# Patient Record
Sex: Male | Born: 1965 | Race: White | Hispanic: No | Smoking: Current every day smoker
Health system: Southern US, Community
[De-identification: ages and names within clinical notes are randomized; demographics above are authoritative.]

## PROBLEM LIST (undated history)

## (undated) DIAGNOSIS — B019 Varicella without complication: Secondary | ICD-10-CM

## (undated) DIAGNOSIS — G43909 Migraine, unspecified, not intractable, without status migrainosus: Secondary | ICD-10-CM

## (undated) DIAGNOSIS — I00 Rheumatic fever without heart involvement: Secondary | ICD-10-CM

## (undated) HISTORY — PX: TOOTH EXTRACTION: SUR596

## (undated) HISTORY — DX: Varicella without complication: B01.9

## (undated) HISTORY — DX: Rheumatic fever without heart involvement: I00

## (undated) HISTORY — PX: MEDIAL PARTIAL KNEE REPLACEMENT: SHX5965

---

## 1999-10-31 ENCOUNTER — Emergency Department (HOSPITAL_COMMUNITY): Admission: EM | Admit: 1999-10-31 | Discharge: 1999-10-31 | Payer: Self-pay | Admitting: Emergency Medicine

## 1999-10-31 ENCOUNTER — Encounter: Payer: Self-pay | Admitting: *Deleted

## 2010-05-03 ENCOUNTER — Ambulatory Visit: Payer: Self-pay | Admitting: Infectious Diseases

## 2010-05-03 ENCOUNTER — Inpatient Hospital Stay (HOSPITAL_COMMUNITY): Admission: EM | Admit: 2010-05-03 | Discharge: 2010-05-04 | Payer: Self-pay | Admitting: Emergency Medicine

## 2010-10-11 ENCOUNTER — Emergency Department (HOSPITAL_COMMUNITY): Admission: EM | Admit: 2010-10-11 | Discharge: 2010-10-12 | Payer: Self-pay | Admitting: Emergency Medicine

## 2010-10-13 ENCOUNTER — Emergency Department (HOSPITAL_COMMUNITY): Admission: EM | Admit: 2010-10-13 | Discharge: 2010-10-13 | Payer: Self-pay | Admitting: Emergency Medicine

## 2011-02-09 LAB — CBC
HCT: 43.4 % (ref 39.0–52.0)
Hemoglobin: 14.9 g/dL (ref 13.0–17.0)
MCH: 31.8 pg (ref 26.0–34.0)
MCHC: 34.3 g/dL (ref 30.0–36.0)
MCV: 92.5 fL (ref 78.0–100.0)
Platelets: 212 10*3/uL (ref 150–400)
RBC: 4.69 MIL/uL (ref 4.22–5.81)
RDW: 12.4 % (ref 11.5–15.5)
WBC: 11.8 10*3/uL — ABNORMAL HIGH (ref 4.0–10.5)

## 2011-02-09 LAB — POCT I-STAT, CHEM 8
BUN: 11 mg/dL (ref 6–23)
Calcium, Ion: 1.18 mmol/L (ref 1.12–1.32)
Chloride: 102 mEq/L (ref 96–112)
Glucose, Bld: 121 mg/dL — ABNORMAL HIGH (ref 70–99)
TCO2: 28 mmol/L (ref 0–100)

## 2011-02-09 LAB — DIFFERENTIAL
Basophils Relative: 0 % (ref 0–1)
Eosinophils Relative: 1 % (ref 0–5)
Monocytes Absolute: 0.9 10*3/uL (ref 0.1–1.0)
Monocytes Relative: 7 % (ref 3–12)
Neutro Abs: 8.2 10*3/uL — ABNORMAL HIGH (ref 1.7–7.7)

## 2011-02-15 LAB — GLUCOSE, CAPILLARY: Glucose-Capillary: 127 mg/dL — ABNORMAL HIGH (ref 70–99)

## 2011-02-15 LAB — MAGNESIUM: Magnesium: 1.8 mg/dL (ref 1.5–2.5)

## 2011-02-15 LAB — HIV ANTIBODY (ROUTINE TESTING W REFLEX): HIV: NONREACTIVE

## 2011-02-15 LAB — T4, FREE: Free T4: 0.79 ng/dL — ABNORMAL LOW (ref 0.80–1.80)

## 2011-02-15 LAB — CARDIAC PANEL(CRET KIN+CKTOT+MB+TROPI)
Relative Index: 0.7 (ref 0.0–2.5)
Relative Index: 0.8 (ref 0.0–2.5)
Total CK: 107 U/L (ref 7–232)
Total CK: 112 U/L (ref 7–232)
Troponin I: 0.01 ng/mL (ref 0.00–0.06)
Troponin I: 0.01 ng/mL (ref 0.00–0.06)

## 2011-02-15 LAB — CBC
HCT: 37.8 % — ABNORMAL LOW (ref 39.0–52.0)
Hemoglobin: 15.2 g/dL (ref 13.0–17.0)
MCHC: 35.2 g/dL (ref 30.0–36.0)
MCHC: 35.4 g/dL (ref 30.0–36.0)
MCHC: 35.6 g/dL (ref 30.0–36.0)
MCV: 95.1 fL (ref 78.0–100.0)
MCV: 95.5 fL (ref 78.0–100.0)
Platelets: 136 10*3/uL — ABNORMAL LOW (ref 150–400)
Platelets: 148 10*3/uL — ABNORMAL LOW (ref 150–400)
RBC: 4.58 MIL/uL (ref 4.22–5.81)
WBC: 5.1 10*3/uL (ref 4.0–10.5)

## 2011-02-15 LAB — LIPID PANEL
Cholesterol: 129 mg/dL (ref 0–200)
HDL: 47 mg/dL (ref >39)
LDL Cholesterol: 70 mg/dL (ref 0–99)
Total CHOL/HDL Ratio: 2.7 RATIO
Triglycerides: 60 mg/dL (ref <150)
VLDL: 12 mg/dL (ref 0–40)

## 2011-02-15 LAB — LEGIONELLA ANTIGEN, URINE: Legionella Antigen, Urine: NEGATIVE

## 2011-02-15 LAB — COMPREHENSIVE METABOLIC PANEL
ALT: 16 U/L (ref 0–53)
Alkaline Phosphatase: 85 U/L (ref 39–117)
CO2: 24 mEq/L (ref 19–32)
Calcium: 9.1 mg/dL (ref 8.4–10.5)
GFR calc non Af Amer: >60 mL/min (ref >60)
Glucose, Bld: 110 mg/dL — ABNORMAL HIGH (ref 70–99)
Sodium: 133 mEq/L — ABNORMAL LOW (ref 135–145)
Total Bilirubin: 0.7 mg/dL (ref 0.3–1.2)

## 2011-02-15 LAB — BASIC METABOLIC PANEL
BUN: 7 mg/dL (ref 6–23)
BUN: 9 mg/dL (ref 6–23)
CO2: 27 mEq/L (ref 19–32)
CO2: 27 mEq/L (ref 19–32)
Chloride: 105 mEq/L (ref 96–112)
Chloride: 107 mEq/L (ref 96–112)
Creatinine, Ser: 1.01 mg/dL (ref 0.4–1.5)
Potassium: 3.3 mEq/L — ABNORMAL LOW (ref 3.5–5.1)

## 2011-02-15 LAB — URINALYSIS, ROUTINE W REFLEX MICROSCOPIC
Bilirubin Urine: NEGATIVE
Glucose, UA: NEGATIVE mg/dL
Ketones, ur: NEGATIVE mg/dL
Protein, ur: NEGATIVE mg/dL

## 2011-02-15 LAB — DIFFERENTIAL
Basophils Absolute: 0 10*3/uL (ref 0.0–0.1)
Basophils Relative: 0 % (ref 0–1)
Eosinophils Absolute: 0 10*3/uL (ref 0.0–0.7)
Lymphs Abs: 0.7 10*3/uL (ref 0.7–4.0)
Neutrophils Relative %: 83 % — ABNORMAL HIGH (ref 43–77)

## 2011-02-15 LAB — CK TOTAL AND CKMB (NOT AT ARMC)
CK, MB: 0.8 ng/mL (ref 0.3–4.0)
Relative Index: 0.7 (ref 0.0–2.5)
Total CK: 114 U/L (ref 7–232)

## 2011-02-15 LAB — RAPID URINE DRUG SCREEN, HOSP PERFORMED
Amphetamines: NOT DETECTED
Barbiturates: NOT DETECTED
Benzodiazepines: NOT DETECTED
Cocaine: NOT DETECTED
Opiates: NOT DETECTED

## 2011-02-15 LAB — POCT I-STAT 3, ART BLOOD GAS (G3+)
O2 Saturation: 98 %
Patient temperature: 98
TCO2: 25 mmol/L (ref 0–100)

## 2011-02-15 LAB — CULTURE, BLOOD (ROUTINE X 2)

## 2011-02-15 LAB — URINE CULTURE

## 2011-02-15 LAB — ROCKY MTN SPOTTED FVR AB, IGM-BLOOD: RMSF IgM: 0.27 IV (ref 0.00–0.89)

## 2011-02-15 LAB — ACETAMINOPHEN LEVEL: Acetaminophen (Tylenol), Serum: <10 ug/mL — ABNORMAL LOW (ref 10–30)

## 2011-02-15 LAB — PROCALCITONIN: Procalcitonin: <0.5 ng/mL

## 2011-02-15 LAB — LIPASE, BLOOD: Lipase: 22 U/L (ref 11–59)

## 2013-04-06 ENCOUNTER — Emergency Department: Payer: Self-pay | Admitting: Unknown Physician Specialty

## 2015-09-01 ENCOUNTER — Encounter: Payer: Self-pay | Admitting: Emergency Medicine

## 2015-09-01 ENCOUNTER — Emergency Department
Admission: EM | Admit: 2015-09-01 | Discharge: 2015-09-01 | Disposition: A | Discharge status: Home / Self Care | Payer: Worker's Compensation | Attending: Emergency Medicine | Admitting: Emergency Medicine

## 2015-09-01 ENCOUNTER — Emergency Department: Payer: Worker's Compensation

## 2015-09-01 DIAGNOSIS — Y9389 Activity, other specified: Secondary | ICD-10-CM | POA: Insufficient documentation

## 2015-09-01 DIAGNOSIS — S51032A Puncture wound without foreign body of left elbow, initial encounter: Secondary | ICD-10-CM | POA: Diagnosis present

## 2015-09-01 DIAGNOSIS — W228XXA Striking against or struck by other objects, initial encounter: Secondary | ICD-10-CM | POA: Insufficient documentation

## 2015-09-01 DIAGNOSIS — Z23 Encounter for immunization: Secondary | ICD-10-CM | POA: Diagnosis not present

## 2015-09-01 DIAGNOSIS — Y990 Civilian activity done for income or pay: Secondary | ICD-10-CM | POA: Diagnosis not present

## 2015-09-01 DIAGNOSIS — Z720 Tobacco use: Secondary | ICD-10-CM | POA: Diagnosis not present

## 2015-09-01 DIAGNOSIS — Z880 Allergy status to penicillin: Secondary | ICD-10-CM | POA: Diagnosis not present

## 2015-09-01 DIAGNOSIS — Y9289 Other specified places as the place of occurrence of the external cause: Secondary | ICD-10-CM | POA: Insufficient documentation

## 2015-09-01 MED ORDER — CEPHALEXIN 500 MG PO CAPS: 500.0000 mg | ORAL_CAPSULE | Freq: Four times a day (QID) | ORAL | Status: AC

## 2015-09-01 MED ORDER — TETANUS-DIPHTH-ACELL PERTUSSIS 5-2.5-18.5 LF-MCG/0.5 IM SUSP
0.5000 mL | Freq: Once | INTRAMUSCULAR | Status: AC
  Administered 2015-09-01: 0.5 mL via INTRAMUSCULAR
  Filled 2015-09-01: qty 0.5

## 2015-09-01 NOTE — Discharge Instructions (Signed)
Puncture Wound °A puncture wound is an injury that extends through all layers of the skin and into the tissue beneath the skin (subcutaneous tissue). Puncture wounds become infected easily because germs often enter the body and go beneath the skin during the injury. Having a deep wound with a small entrance point makes it difficult for your caregiver to adequately clean the wound. This is especially true if you have stepped on a nail and it has passed through a dirty shoe or other situations where the wound is obviously contaminated. °CAUSES  °Many puncture wounds involve glass, nails, splinters, fish hooks, or other objects that enter the skin (foreign bodies). A puncture wound may also be caused by a human bite or animal bite. °DIAGNOSIS  °A puncture wound is usually diagnosed by your history and a physical exam. You may need to have an X-ray or an ultrasound to check for any foreign bodies still in the wound. °TREATMENT  °· Your caregiver will clean the wound as thoroughly as possible. Depending on the location of the wound, a bandage (dressing) may be applied. °· Your caregiver might prescribe antibiotic medicines. °· You may need a follow-up visit to check on your wound. Follow all instructions as directed by your caregiver. °HOME CARE INSTRUCTIONS  °· Change your dressing once per day, or as directed by your caregiver. If the dressing sticks, it may be removed by soaking the area in water. °· If your caregiver has given you follow-up instructions, it is very important that you return for a follow-up appointment. Not following up as directed could result in a chronic or permanent injury, pain, and disability. °· Only take over-the-counter or prescription medicines for pain, discomfort, or fever as directed by your caregiver. °· If you are given antibiotics, take them as directed. Finish them even if you start to feel better. °You may need a tetanus shot if: °· You cannot remember when you had your last tetanus  shot. °· You have never had a tetanus shot. °If you got a tetanus shot, your arm may swell, get red, and feel warm to the touch. This is common and not a problem. If you need a tetanus shot and you choose not to have one, there is a rare chance of getting tetanus. Sickness from tetanus can be serious. °You may need a rabies shot if an animal bite caused your puncture wound. °SEEK MEDICAL CARE IF:  °· You have redness, swelling, or increasing pain in the wound. °· You have red streaks going away from the wound. °· You notice a bad smell coming from the wound or dressing. °· You have yellowish-white fluid (pus) coming from the wound. °· You are treated with an antibiotic for infection, but the infection is not getting better. °· You notice something in the wound, such as rubber from your shoe, cloth, or another object. °· You have a fever. °· You have severe pain. °· You have difficulty breathing. °· You feel dizzy or faint. °· You cannot stop vomiting. °· You lose feeling, develop numbness, or cannot move a limb below the wound. °· Your symptoms worsen. °MAKE SURE YOU: °· Understand these instructions. °· Will watch your condition. °· Will get help right away if you are not doing well or get worse. °Document Released: 08/25/2005 Document Revised: 02/07/2012 Document Reviewed: 05/04/2011 °ExitCare® Patient Information ©2015 [employer]. This information is not intended to replace advice given to you by your health care provider. Make sure you discuss any questions you   have with your health care provider.  Please return immediately if condition worsens. Please contact her primary physician or the physician you were given for referral. If you have any specialist physicians involved in her treatment and plan please also contact them. Thank you for using  [facility].

## 2015-09-01 NOTE — ED Notes (Signed)
Pt's employer, [employer] recycling, not on file. Marolyn Haller, Scientist, water quality, at bedside and reports he needs a UDS through [facility]. Denies any need to breathalyze patient.

## 2015-09-01 NOTE — ED Notes (Signed)
Pt reports he was working on a piece of machinery at work, a piece of wire stuck him in the left [facility]. Pt reports blood was squirting out of arm, bandage applied, pt pale and diaphoretic.

## 2015-09-02 NOTE — ED Provider Notes (Signed)
Time Seen: Approximately *1700 I have reviewed the triage notes  Chief Complaint: Puncture Wound   History of Present Illness: Randy Roman is a 49 y.o. 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 The patient states that there was "" a lot of blood in it seemed to these "" squirting out of his arm "". Doreatha Martin was applied to seen the patient was described as being pale and diaphoretic. Patient states he feels improved at this point. He denies any other significant injury. Patient tetanus status is unknown  History reviewed. No pertinent past medical history.  There are no active problems to display for this patient.   History reviewed. No pertinent past surgical history.  History reviewed. No pertinent past surgical history.  Current Outpatient Rx  Name  Route  Sig  Dispense  Refill  . cephALEXin (KEFLEX) 500 MG capsule   Oral   Take 1 capsule (500 mg total) by mouth 4 (four) times daily.   28 capsule   0     Allergies:  Penicillins  Family History: No family history on file.  Social History: Social History  Substance Use Topics  . Smoking status: Current Every Day Smoker  . Smokeless tobacco: None  . Alcohol Use: Yes     Review of Systems:   Shows no other significant injury. He is currently not on any anticoagulation therapy He states he is otherwise in good health  Physical Exam:  ED Triage Vitals  Enc Vitals Group     BP 09/01/15 1613 164/98 mmHg     Pulse Rate 09/01/15 1613 119     Resp 09/01/15 1613 20     Temp 09/01/15 1613 97.8 F (36.6 C)     Temp Source 09/01/15 1613 Oral     SpO2 09/01/15 1613 95 %     Weight 09/01/15 1613 195 lb (88.451 kg)     Height 09/01/15 1613  (1.702 m)     Head Cir --      Peak Flow --      Pain Score 09/01/15 1614 5     Pain Loc --      Pain Edu? --      Excl. in GC? --     General: Awake , Alert ,  and Oriented times 3; GCS 15 Head: Normal cephalic , atraumatic Neck: Supple, Full range of motion,  Lungs: Clear to ascultation without wheezes , rhonchi, or rales Heart: Regular rate, regular rhythm without murmurs , gallops , or rubs  Extremities: Close examination of the left elbow shows a punctate lesion anterior remote from the joint. The extremity is neurovascularly intact. There is no active bleeding at this time. 2 plus symmetric pulses. No edema, clubbing or cyanosis Neurologic: normal ambulation, Motor symmetric without deficits, sensory intact Skin: warm, dry, no rashes    Radiology:  X-ray showed no foreign body present I personally reviewed the radiologic studies   Procedures: Patient had a left elbow pressure dressing applied.  ED Course: Patient's stay here was uneventful he had no other further bleeding. I advised him to leave the current dressing in place for 24 hours and then change it. He was prescribed Keflex on an outpatient basis. He was advised to return here if he has any other new concerns. The injury did not appear to affect any deep vascular structure and most  likely was a skin than usual or arterial. The wound is punctate and did not appear to have any significant depth that led and fall of the left elbow joint    Assessment:  Puncture wound left elbow   Final Clinical Impression:   Final diagnoses:  Puncture wound of elbow, left, initial encounter     Plan:  Outpatient management           Jennye Moccasin, MD 09/02/15 [phone]

## 2016-02-08 ENCOUNTER — Encounter (HOSPITAL_COMMUNITY): Payer: Self-pay | Admitting: Emergency Medicine

## 2016-02-08 ENCOUNTER — Emergency Department (HOSPITAL_COMMUNITY)
Admission: EM | Admit: 2016-02-08 | Discharge: 2016-02-08 | Disposition: A | Discharge status: Home / Self Care | Payer: Self-pay | Attending: Emergency Medicine | Admitting: Emergency Medicine

## 2016-02-08 ENCOUNTER — Emergency Department (HOSPITAL_COMMUNITY): Payer: Self-pay

## 2016-02-08 ENCOUNTER — Other Ambulatory Visit: Payer: Self-pay

## 2016-02-08 DIAGNOSIS — Z8679 Personal history of other diseases of the circulatory system: Secondary | ICD-10-CM | POA: Insufficient documentation

## 2016-02-08 DIAGNOSIS — Z880 Allergy status to penicillin: Secondary | ICD-10-CM | POA: Insufficient documentation

## 2016-02-08 DIAGNOSIS — F17200 Nicotine dependence, unspecified, uncomplicated: Secondary | ICD-10-CM | POA: Insufficient documentation

## 2016-02-08 DIAGNOSIS — H5710 Ocular pain, unspecified eye: Secondary | ICD-10-CM | POA: Insufficient documentation

## 2016-02-08 DIAGNOSIS — J0100 Acute maxillary sinusitis, unspecified: Secondary | ICD-10-CM | POA: Insufficient documentation

## 2016-02-08 HISTORY — DX: Migraine, unspecified, not intractable, without status migrainosus: G43.909

## 2016-02-08 LAB — DIFFERENTIAL
BASOS ABS: 0 10*3/uL (ref 0.0–0.1)
BASOS PCT: 0 %
EOS ABS: 0.1 10*3/uL (ref 0.0–0.7)
EOS PCT: 1 %
Lymphocytes Relative: 22 %
Lymphs Abs: 2 10*3/uL (ref 0.7–4.0)
MONO ABS: 0.6 10*3/uL (ref 0.1–1.0)
MONOS PCT: 6 %
Neutro Abs: 6.7 10*3/uL (ref 1.7–7.7)
Neutrophils Relative %: 71 %

## 2016-02-08 LAB — COMPREHENSIVE METABOLIC PANEL
ALT: 19 U/L (ref 17–63)
ANION GAP: 12 (ref 5–15)
AST: 19 U/L (ref 15–41)
Albumin: 4 g/dL (ref 3.5–5.0)
Alkaline Phosphatase: 105 U/L (ref 38–126)
BUN: 9 mg/dL (ref 6–20)
CALCIUM: 9.5 mg/dL (ref 8.9–10.3)
CO2: 25 mmol/L (ref 22–32)
CREATININE: 0.97 mg/dL (ref 0.61–1.24)
Chloride: 100 mmol/L — ABNORMAL LOW (ref 101–111)
GLUCOSE: 100 mg/dL — AB (ref 65–99)
POTASSIUM: 4.5 mmol/L (ref 3.5–5.1)
Sodium: 137 mmol/L (ref 135–145)
TOTAL PROTEIN: 7.5 g/dL (ref 6.5–8.1)
Total Bilirubin: 0.8 mg/dL (ref 0.3–1.2)

## 2016-02-08 LAB — I-STAT TROPONIN, ED: TROPONIN I, POC: 0.01 ng/mL (ref 0.00–0.08)

## 2016-02-08 LAB — CBC
HEMATOCRIT: 49.3 % (ref 39.0–52.0)
Hemoglobin: 16.3 g/dL (ref 13.0–17.0)
MCH: 32 pg (ref 26.0–34.0)
MCHC: 33.1 g/dL (ref 30.0–36.0)
MCV: 96.7 fL (ref 78.0–100.0)
Platelets: 268 10*3/uL (ref 150–400)
RBC: 5.1 MIL/uL (ref 4.22–5.81)
RDW: 13.1 % (ref 11.5–15.5)
WBC: 9.3 10*3/uL (ref 4.0–10.5)

## 2016-02-08 MED ORDER — PREDNISONE 50 MG PO TABS: ORAL_TABLET | ORAL | Status: DC

## 2016-02-08 MED ORDER — SODIUM CHLORIDE 0.9 % IV BOLUS (SEPSIS)
1000.0000 mL | Freq: Once | INTRAVENOUS | Status: AC
Start: 2016-02-08 — End: 2016-02-08
  Administered 2016-02-08: 1000 mL via INTRAVENOUS

## 2016-02-08 MED ORDER — DOXYCYCLINE HYCLATE 100 MG PO CAPS: 100.0000 mg | ORAL_CAPSULE | Freq: Two times a day (BID) | ORAL | Status: DC

## 2016-02-08 MED ORDER — OXYCODONE-ACETAMINOPHEN 5-325 MG PO TABS: ORAL_TABLET | ORAL | Status: DC

## 2016-02-08 MED ORDER — ONDANSETRON HCL 4 MG/2ML IJ SOLN
4.0000 mg | Freq: Once | INTRAMUSCULAR | Status: AC
  Administered 2016-02-08: 4 mg via INTRAVENOUS
  Filled 2016-02-08: qty 2

## 2016-02-08 MED ORDER — DOXYCYCLINE HYCLATE 100 MG PO TABS
100.0000 mg | ORAL_TABLET | Freq: Once | ORAL | Status: AC
  Administered 2016-02-08: 100 mg via ORAL
  Filled 2016-02-08: qty 1

## 2016-02-08 MED ORDER — MORPHINE SULFATE (PF) 4 MG/ML IV SOLN
4.0000 mg | Freq: Once | INTRAVENOUS | Status: AC
  Administered 2016-02-08: 4 mg via INTRAVENOUS
  Filled 2016-02-08: qty 1

## 2016-02-08 MED ORDER — OXYCODONE-ACETAMINOPHEN 5-325 MG PO TABS
1.0000 | ORAL_TABLET | Freq: Once | ORAL | Status: AC
  Administered 2016-02-08: 1 via ORAL
  Filled 2016-02-08: qty 1

## 2016-02-08 MED ORDER — PREDNISONE 20 MG PO TABS
60.0000 mg | ORAL_TABLET | Freq: Once | ORAL | Status: AC
  Administered 2016-02-08: 60 mg via ORAL
  Filled 2016-02-08: qty 3

## 2016-02-08 NOTE — ED Notes (Signed)
Pt verbalized understanding of d/c instructions and has no further questions. Pt stable and nad. Pt d/c home with wife driving.

## 2016-02-08 NOTE — ED Provider Notes (Signed)
CSN: [id number]     Arrival date & time 02/08/16  1424 History   First MD Initiated Contact with Patient 02/08/16 1624     Chief Complaint  Patient presents with  . Headache  . Eye Pain     (Consider location/radiation/quality/duration/timing/severity/associated sxs/prior Treatment) HPI  Blood pressure 168/100, pulse 91, temperature 97.5 F (36.4 C), temperature source Oral, resp. rate 22, weight 94.15 kg, SpO2 95 %.  Randy Roman is a 50 y.o. male complaining of severe right-sided periorbital headache onset late Friday night Saturday morning, woke him from sleep. Patient typically does not have headaches. He states that the pain comes in waves. States it's 5 out of 10 right now, 10 out of 10 at worst. Cannot quantify how long it took from the onset of the headache to reach maximal intensity. Patient states that the pain is exacerbated by leaning forward, Valsalva or cough. He denies fever, chills, change in vision, dysarthria, cervicalgia, syncope, numbness, weakness, chest pain, abdominal pain, nausea, vomiting. On review of systems patient notes a ataxia with no falling. Note that triage note states that patient is unable to keep his eyes open however patient denies this to me. States that when the pain is severe he closes his eyes.   Past Medical History  Diagnosis Date  . Migraine    History reviewed. No pertinent past surgical history. No family history on file. Social History  Substance Use Topics  . Smoking status: Current Every Day Smoker  . Smokeless tobacco: None  . Alcohol Use: Yes    Review of Systems  10 systems reviewed and found to be negative, except as noted in the HPI.   Allergies  Penicillins and Asa  Home Medications   Prior to Admission medications   Medication Sig Start Date End Date Taking? Authorizing Provider  ibuprofen (ADVIL,MOTRIN) 200 MG tablet Take 800 mg by mouth every 6 (six) hours as needed for headache.   Yes Historical Provider, MD   Protein POWD Take 1 scoop by mouth daily as needed (FOR WORKING OUT).   Yes Historical Provider, MD  doxycycline (VIBRAMYCIN) 100 MG capsule Take 1 capsule (100 mg total) by mouth 2 (two) times daily. 02/08/16   Laiza Veenstra, PA-C  oxyCODONE-acetaminophen (PERCOCET/ROXICET) 5-325 MG tablet 1 to 2 tabs PO q6hrs  PRN for pain 02/08/16   Joni Reining Codi Kertz, PA-C  predniSONE (DELTASONE) 50 MG tablet Take 1 tablet daily with breakfast 02/08/16   Demorio Seeley, PA-C   BP 129/92 mmHg  Pulse 80  Temp(Src) 97.5 F (36.4 C) (Oral)  Resp 18  Wt 94.15 kg  SpO2 95% Physical Exam  Constitutional: He is oriented to person, place, and time. He appears well-developed and well-nourished. No distress.  HENT:  Head: Normocephalic and atraumatic.  Mouth/Throat: Oropharynx is clear and moist.  Eyes: Conjunctivae and EOM are normal. Pupils are equal, round, and reactive to light.  Cardiovascular: Normal rate, regular rhythm and intact distal pulses.   Pulmonary/Chest: Effort normal and breath sounds normal. No stridor. No respiratory distress. He has no wheezes. He has no rales. He exhibits no tenderness.  Abdominal: Soft. Bowel sounds are normal.  Musculoskeletal: Normal range of motion.  Neurological: He is alert and oriented to person, place, and time.  Chronic bilateral upper extremity tremor II-Visual fields grossly intact. III/IV/VI-Extraocular movements intact.  Pupils reactive bilaterally. V/VII-Smile symmetric, equal eyebrow raise,  facial sensation intact VIII- Hearing grossly intact IX/X-Normal gag XI-bilateral shoulder shrug XII-midline tongue extension Motor: 5/5 bilaterally with normal  tone and bulk Cerebellar: Normal finger-to-nose (grossly normal, difficult to evaluate properly secondary to tremor) and normal heel-to-shin test.       Psychiatric: He has a normal mood and affect.  Nursing note and vitals reviewed.   ED Course  Procedures (including critical care time) Labs  Review Labs Reviewed  COMPREHENSIVE METABOLIC PANEL - Abnormal; Notable for the following:    Chloride 100 (*)    Glucose, Bld 100 (*)    All other components within normal limits  CBC  DIFFERENTIAL  I-STAT TROPOININ, ED    Imaging Review Ct Head Wo Contrast  02/08/2016  CLINICAL DATA:  Severe headache for 2 days. EXAM: CT HEAD WITHOUT CONTRAST TECHNIQUE: Contiguous axial images were obtained from the base of the skull through the vertex without intravenous contrast. COMPARISON:  05/02/2010 FINDINGS: No acute intracranial abnormality. Specifically, no hemorrhage, hydrocephalus, mass lesion, acute infarction, or significant intracranial injury. No acute calvarial abnormality. Complete opacification of the right maxillary sinus and anterior ethmoid air cells. Air-fluid level noted in the right frontal sinus. Rounded soft tissue in the floor the left maxillary sinus. Mastoid air cells are clear. IMPRESSION: No intracranial abnormality. Extensive sinusitis with complete opacification of the right maxillary sinus and anterior ethmoid air cells. Air-fluid level in the right frontal sinus compatible with acute sinusitis. Electronically Signed   By: Charlett NoseKevin  Dover M.D.   On: 02/08/2016 18:10   I have personally reviewed and evaluated these images and lab results as part of my medical decision-making.   EKG Interpretation None      MDM   Final diagnoses:  Acute maxillary sinusitis, recurrence not specified    Filed Vitals:   02/08/16 1845 02/08/16 1900 02/08/16 1915 02/08/16 1945  BP: 137/80 130/83 121/85 129/92  Pulse: 81 82 96 80  Temp:      TempSrc:      Resp: 17 15 25 18   Weight:      SpO2: 94% 94% 96% 95%    Medications  sodium chloride 0.9 % bolus 1,000 mL (0 mLs Intravenous Stopped 02/08/16 1914)  morphine 4 MG/ML injection 4 mg (4 mg Intravenous Given 02/08/16 1814)  ondansetron (ZOFRAN) injection 4 mg (4 mg Intravenous Given 02/08/16 1814)  oxyCODONE-acetaminophen  (PERCOCET/ROXICET) 5-325 MG per tablet 1 tablet (1 tablet Oral Given 02/08/16 2000)  predniSONE (DELTASONE) tablet 60 mg (60 mg Oral Given 02/08/16 2000)  doxycycline (VIBRA-TABS) tablet 100 mg (100 mg Oral Given 02/08/16 2000)    Randy Roman is 50 y.o. male presenting with severe right periorbital headache, he is not normally affected with headaches. His neuro exam is nonfocal. Given the severity of his symptoms and atypical nature, would consider subarachnoid hemorrhage. Patient with no meningeal signs, neuro exam is nonfocal however they are difficult to evaluate based on his chronic bilateral hand tremor. Plan to CT and continue to discuss pros and cons of obtaining lumbar puncture.  CT demonstrates a right maxillary sinusitis consistent with the area of this patient's pain. It is likely the source of his symptoms. Patient penicillin allergy, will start doxycycline. No provided. Recommend Flonase and pushing fluids. ENT referral given based on the severity of his pain.  Evaluation does not show pathology that would require ongoing emergent intervention or inpatient treatment. Pt is hemodynamically stable and mentating appropriately. Discussed findings and plan with patient/guardian, who agrees with care plan. All questions answered. Return precautions discussed and outpatient follow up given.   New Prescriptions   DOXYCYCLINE (VIBRAMYCIN) 100 MG CAPSULE  Take 1 capsule (100 mg total) by mouth 2 (two) times daily.   OXYCODONE-ACETAMINOPHEN (PERCOCET/ROXICET) 5-325 MG TABLET    1 to 2 tabs PO q6hrs  PRN for pain   PREDNISONE (DELTASONE) 50 MG TABLET    Take 1 tablet daily with breakfast         Wynetta Emery, PA-C 02/08/16 2008  Gwyneth Sprout, MD 02/09/16 1416

## 2016-02-08 NOTE — Discharge Instructions (Signed)
Use nasal saline (you can try [employer] Simply Saline) at least 4 times a day, use saline 5-10 minutes before using the fluticasone (flonase) nasal spray  Do not use Afrin (Oxymetazoline)  Rest, wash hands frequently  and drink plenty of water.  You may try over the counter medication such as Mucinex or decongestants.  Push fluids to try to thin the mucus and get it to flow out the nose.    Please follow with your primary care doctor in the next 2 days for a check-up. They must obtain records for further management.   Do not hesitate to return to the Emergency Department for any new, worsening or concerning symptoms.    Sinusitis, Adult Sinusitis is redness, soreness, and inflammation of the paranasal sinuses. Paranasal sinuses are air pockets within the bones of your face. They are located beneath your eyes, in the middle of your forehead, and above your eyes. In healthy paranasal sinuses, mucus is able to drain out, and air is able to circulate through them by way of your nose. However, when your paranasal sinuses are inflamed, mucus and air can become trapped. This can allow bacteria and other germs to grow and cause infection. Sinusitis can develop quickly and last only a short time (acute) or continue over a long period (chronic). Sinusitis that lasts for more than 12 weeks is considered chronic. CAUSES Causes of sinusitis include:  Allergies.  Structural abnormalities, such as displacement of the cartilage that separates your nostrils (deviated septum), which can decrease the air flow through your nose and sinuses and affect sinus drainage.  Functional abnormalities, such as when the small hairs (cilia) that line your sinuses and help remove mucus do not work properly or are not present. SIGNS AND SYMPTOMS Symptoms of acute and chronic sinusitis are the same. The primary symptoms are pain and pressure around the affected sinuses. Other symptoms include:  Upper  toothache.  Earache.  Headache.  Bad breath.  Decreased sense of smell and taste.  A cough, which worsens when you are lying flat.  Fatigue.  Fever.  Thick drainage from your nose, which often is green and may contain pus (purulent).  Swelling and warmth over the affected sinuses. DIAGNOSIS Your health care provider will perform a physical exam. During your exam, your health care provider may perform any of the following to help determine if you have acute sinusitis or chronic sinusitis:  Look in your nose for signs of abnormal growths in your nostrils (nasal polyps).  Tap over the affected sinus to check for signs of infection.  View the inside of your sinuses using an imaging device that has a light attached (endoscope). If your health care provider suspects that you have chronic sinusitis, one or more of the following tests may be recommended:  Allergy tests.  Nasal culture. A sample of mucus is taken from your nose, sent to a lab, and screened for bacteria.  Nasal cytology. A sample of mucus is taken from your nose and examined by your health care provider to determine if your sinusitis is related to an allergy. TREATMENT Most cases of acute sinusitis are related to a viral infection and will resolve on their own within 10 days. Sometimes, medicines are prescribed to help relieve symptoms of both acute and chronic sinusitis. These may include pain medicines, decongestants, nasal steroid sprays, or saline sprays. However, for sinusitis related to a bacterial infection, your health care provider will prescribe antibiotic medicines. These are medicines that will  help kill the bacteria causing the infection. Rarely, sinusitis is caused by a fungal infection. In these cases, your health care provider will prescribe antifungal medicine. For some cases of chronic sinusitis, surgery is needed. Generally, these are cases in which sinusitis recurs more than 3 times per year, despite  other treatments. HOME CARE INSTRUCTIONS  Drink plenty of water. Water helps thin the mucus so your sinuses can drain more easily.  Use a humidifier.  Inhale steam 3-4 times a day (for example, sit in the bathroom with the shower running).  Apply a warm, moist washcloth to your face 3-4 times a day, or as directed by your health care provider.  Use saline nasal sprays to help moisten and clean your sinuses.  Take medicines only as directed by your health care provider.  If you were prescribed either an antibiotic or antifungal medicine, finish it all even if you start to feel better. SEEK IMMEDIATE MEDICAL CARE IF:  You have increasing pain or severe headaches.  You have nausea, vomiting, or drowsiness.  You have swelling around your face.  You have vision problems.  You have a stiff neck.  You have difficulty breathing.   This information is not intended to replace advice given to you by your health care provider. Make sure you discuss any questions you have with your health care provider.   Document Released: 11/15/2005 Document Revised: 12/06/2014 Document Reviewed: 11/30/2011 Elsevier Interactive Patient Education [employer]

## 2016-02-08 NOTE — ED Notes (Signed)
Pt c/o severe headache onset Saturday night. When pt bends over he experiences severe pressure in head. Pt unable to keep eyes open. Reports this pain is the worse headache he has felt. Pt denies N/V.

## 2016-04-30 ENCOUNTER — Emergency Department: Payer: BLUE CROSS/BLUE SHIELD

## 2016-04-30 ENCOUNTER — Emergency Department
Admission: EM | Admit: 2016-04-30 | Discharge: 2016-04-30 | Disposition: A | Discharge status: Home / Self Care | Payer: BLUE CROSS/BLUE SHIELD | Attending: Emergency Medicine | Admitting: Emergency Medicine

## 2016-04-30 DIAGNOSIS — Z79899 Other long term (current) drug therapy: Secondary | ICD-10-CM | POA: Insufficient documentation

## 2016-04-30 DIAGNOSIS — Y999 Unspecified external cause status: Secondary | ICD-10-CM | POA: Insufficient documentation

## 2016-04-30 DIAGNOSIS — Y929 Unspecified place or not applicable: Secondary | ICD-10-CM | POA: Diagnosis not present

## 2016-04-30 DIAGNOSIS — F17200 Nicotine dependence, unspecified, uncomplicated: Secondary | ICD-10-CM | POA: Diagnosis not present

## 2016-04-30 DIAGNOSIS — R0789 Other chest pain: Secondary | ICD-10-CM

## 2016-04-30 DIAGNOSIS — X58XXXA Exposure to other specified factors, initial encounter: Secondary | ICD-10-CM | POA: Diagnosis not present

## 2016-04-30 DIAGNOSIS — Y939 Activity, unspecified: Secondary | ICD-10-CM | POA: Diagnosis not present

## 2016-04-30 DIAGNOSIS — S29011A Strain of muscle and tendon of front wall of thorax, initial encounter: Secondary | ICD-10-CM | POA: Diagnosis not present

## 2016-04-30 LAB — COMPREHENSIVE METABOLIC PANEL
ALT: 18 U/L (ref 17–63)
AST: 20 U/L (ref 15–41)
Albumin: 4.4 g/dL (ref 3.5–5.0)
Alkaline Phosphatase: 93 U/L (ref 38–126)
Anion gap: 8 (ref 5–15)
BILIRUBIN TOTAL: 0.3 mg/dL (ref 0.3–1.2)
BUN: 22 mg/dL — AB (ref 6–20)
CHLORIDE: 104 mmol/L (ref 101–111)
CO2: 26 mmol/L (ref 22–32)
CREATININE: 1.04 mg/dL (ref 0.61–1.24)
Calcium: 9.1 mg/dL (ref 8.9–10.3)
Glucose, Bld: 97 mg/dL (ref 65–99)
Potassium: 3.7 mmol/L (ref 3.5–5.1)
Sodium: 138 mmol/L (ref 135–145)
TOTAL PROTEIN: 7.3 g/dL (ref 6.5–8.1)

## 2016-04-30 LAB — CBC
HEMATOCRIT: 44.3 % (ref 40.0–52.0)
Hemoglobin: 15 g/dL (ref 13.0–18.0)
MCH: 32.3 pg (ref 26.0–34.0)
MCHC: 33.9 g/dL (ref 32.0–36.0)
MCV: 95.2 fL (ref 80.0–100.0)
PLATELETS: 207 10*3/uL (ref 150–440)
RBC: 4.65 MIL/uL (ref 4.40–5.90)
RDW: 13.2 % (ref 11.5–14.5)
WBC: 8.1 10*3/uL (ref 3.8–10.6)

## 2016-04-30 LAB — TROPONIN I

## 2016-04-30 MED ORDER — NAPROXEN 500 MG PO TABS: 500.0000 mg | ORAL_TABLET | Freq: Two times a day (BID) | ORAL | Status: DC

## 2016-04-30 NOTE — ED Provider Notes (Signed)
[facility] Emergency Department Provider Note  ____________________________________________  Time seen: 7:50 PM  I have reviewed the triage vital signs and the nursing notes.   HISTORY  Chief Complaint Chest Pain    HPI Randy Roman is a 50 y.o. male patient presents with left chest wall pain that started suddenly at about 6 PM. He was coughing and sneezing forcefully worse when he had us sudden ripping sensation in the left anterior chest. It hurts when he moves. Not exertional, not pleuritic. It does not radiate.It is shortness of breath diaphoresis or vomiting.     Past Medical History  Diagnosis Date  . Migraine      There are no active problems to display for this patient.    No past surgical history on file.   Current Outpatient Rx  Name  Route  Sig  Dispense  Refill  . doxycycline (VIBRAMYCIN) 100 MG capsule   Oral   Take 1 capsule (100 mg total) by mouth 2 (two) times daily.   28 capsule   0   . ibuprofen (ADVIL,MOTRIN) 200 MG tablet   Oral   Take 800 mg by mouth every 6 (six) hours as needed for headache.         . naproxen (NAPROSYN) 500 MG tablet   Oral   Take 1 tablet (500 mg total) by mouth 2 (two) times daily with a meal.   20 tablet   0   . oxyCODONE-acetaminophen (PERCOCET/ROXICET) 5-325 MG tablet      1 to 2 tabs PO q6hrs  PRN for pain   15 tablet   0   . predniSONE (DELTASONE) 50 MG tablet      Take 1 tablet daily with breakfast   5 tablet   0   . Protein POWD   Oral   Take 1 scoop by mouth daily as needed (FOR WORKING OUT).            Allergies Penicillins and Asa   No family history on file.  Social History Social History  Substance Use Topics  . Smoking status: Current Every Day Smoker  . Smokeless tobacco: Not on file  . Alcohol Use: Yes    Review of Systems  Constitutional:   No fever or chills.  Eyes:   No vision changes.  ENT:   No sore throat. No  rhinorrhea. Cardiovascular:   Positive as above chest pain. Respiratory:   No dyspnea or cough. Gastrointestinal:   Negative for abdominal pain, vomiting and diarrhea.  Genitourinary:   Negative for dysuria or difficulty urinating. Musculoskeletal:   Negative for focal pain or swelling Neurological:   Negative for headaches 10-point ROS otherwise negative.  ____________________________________________   PHYSICAL EXAM:  VITAL SIGNS: ED Triage Vitals  Enc Vitals Group     BP 04/30/16 1526 132/97 mmHg     Pulse Rate 04/30/16 1526 101     Resp 04/30/16 1526 22     Temp 04/30/16 1526 98.2 F (36.8 C)     Temp Source 04/30/16 1526 Oral     SpO2 04/30/16 1526 97 %     Weight 04/30/16 1526 203 lb (92.08 kg)     Height 04/30/16 1526 5\' 7"  (1.702 m)     Head Cir --      Peak Flow --      Pain Score 04/30/16 1527 8     Pain Loc --      Pain Edu? --  Excl. in GC? --     Vital signs reviewed, nursing assessments reviewed.   Constitutional:   Alert and oriented. Well appearing and in no distress. Eyes:   No scleral icterus. No conjunctival pallor. PERRL. EOMI.  No nystagmus. ENT   Head:   Normocephalic and atraumatic.   Nose:   No congestion/rhinnorhea. No septal hematoma   Mouth/Throat:   MMM, no pharyngeal erythema. No peritonsillar mass.    Neck:   No stridor. No SubQ emphysema. No meningismus. Hematological/Lymphatic/Immunilogical:   No cervical lymphadenopathy. Cardiovascular:   RRR. Symmetric bilateral radial and DP pulses.  No murmurs.  Respiratory:   Normal respiratory effort without tachypnea nor retractions. Breath sounds are clear and equal bilaterally. No wheezes/rales/rhonchi. Gastrointestinal:   Soft and nontender. Non distended. There is no CVA tenderness.  No rebound, rigidity, or guarding. Genitourinary:   deferred Musculoskeletal:   Nontender with normal range of motion in all extremities. No joint effusions.  No lower extremity tenderness.  No  edema.  There is focal tenderness in the left upper anterior chest wall near the superior insertion of the pectoralis, in the intercostal space. This is severely tender, reproduces his pain. Also with having the patient adduct the left arm across the body against resistance, this reproduces his pain. Neurologic:   Normal speech and language.  CN 2-10 normal. Motor grossly intact. No gross focal neurologic deficits are appreciated.  Skin:    Skin is warm, dry and intact. No rash noted.  No petechiae, purpura, or bullae.  ____________________________________________    LABS (pertinent positives/negatives) (all labs ordered are listed, but only abnormal results are displayed) Labs Reviewed  COMPREHENSIVE METABOLIC PANEL - Abnormal; Notable for the following:    BUN 22 (*)    All other components within normal limits  CBC  TROPONIN I   ____________________________________________   EKG  Interpreted by me Normal sinus rhythm rate of 99, normal axis intervals QRS ST segments and T waves.  ____________________________________________    RADIOLOGY  Chest x-ray unremarkable  ____________________________________________   PROCEDURES   ____________________________________________   INITIAL IMPRESSION / ASSESSMENT AND PLAN / ED COURSE  Pertinent labs & imaging results that were available during my care of the patient were reviewed by me and considered in my medical decision making (see chart for details).  Patient well appearing no acute distress. Sleeping in the stretcher in the hallway while waiting evaluation. Workup negative. Symptoms and exam highly consistent with chest wall pain from muscle strain.Considering the patient's symptoms, medical history, and physical examination today, I have low suspicion for ACS, PE, TAD, pneumothorax, carditis, mediastinitis, pneumonia, CHF, or sepsis.  NSAIDs, heat therapy, gentle range of motion exercises, follow-up with primary  care.     ____________________________________________   FINAL CLINICAL IMPRESSION(S) / ED DIAGNOSES  Final diagnoses:  Chest wall pain  Intercostal muscle strain, initial encounter       Portions of this note were generated with dragon dictation software. Dictation errors may occur despite best attempts at proofreading.   Sharman Cheek, MD 04/30/16 [phone]

## 2016-04-30 NOTE — Discharge Instructions (Signed)

## 2016-04-30 NOTE — ED Notes (Signed)
Pt states that he was coughing at work and felt a ripping sensation in his chest, pt states he has a lot of pain when he takes a deep breath, pt arrives via self driven from work, pt bent forward clutching his chest pt does state some soreness when he touches his left chest area

## 2016-09-14 IMAGING — CT CT HEAD W/O CM
2 series · 16 of 30 positions shown, 20 images · non-contrast
Comparison: 05/02/2010

CLINICAL DATA: Severe headache for 2 days.

EXAM:
CT HEAD WITHOUT CONTRAST
TECHNIQUE: Contiguous axial images were obtained from the base of the skull
through the vertex without intravenous contrast.

[Series 201: head w/o, idose (1) · axial · non-contrast · 0.40mm/px · z∈[+99,+229]mm · 13 of 32 slices shown, 17 images]
[im 3/32  brain]
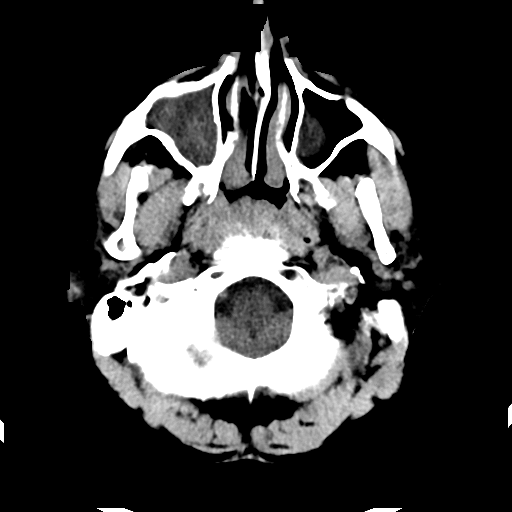
[im 3/32  bone]
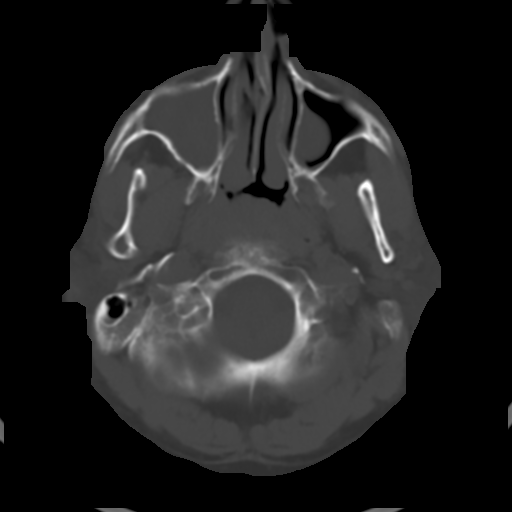
[im 5/32  brain]
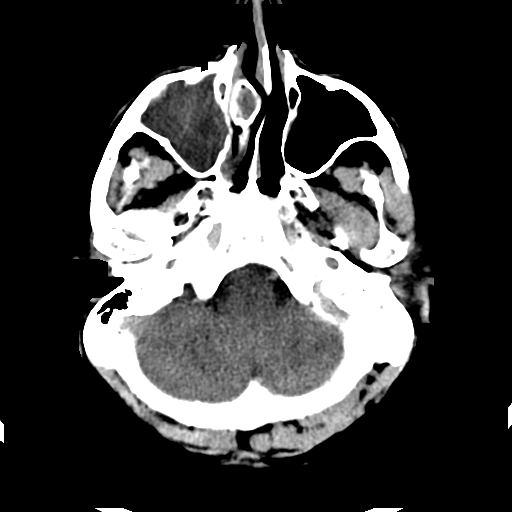
[im 7/32  brain]
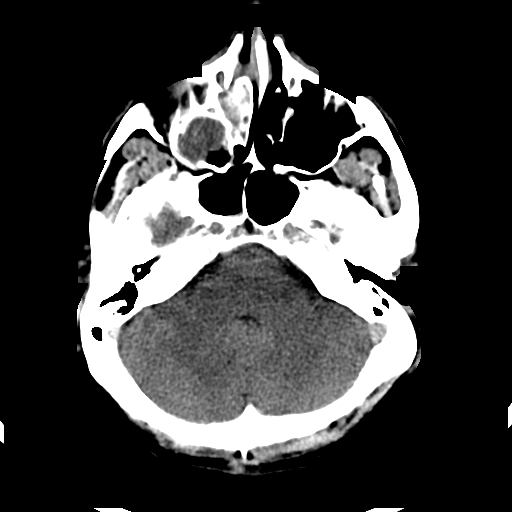
[im 9/32  brain]
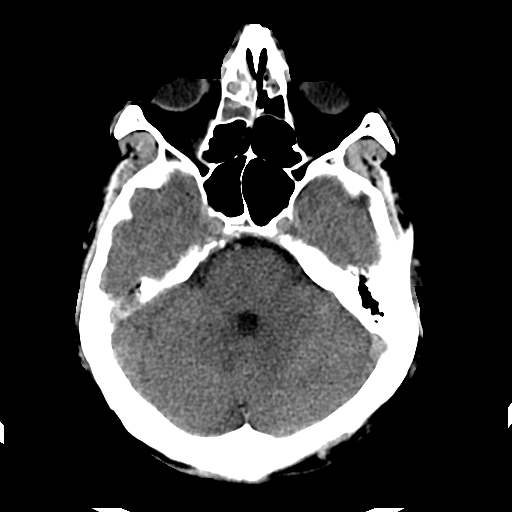
[im 12/32  brain]
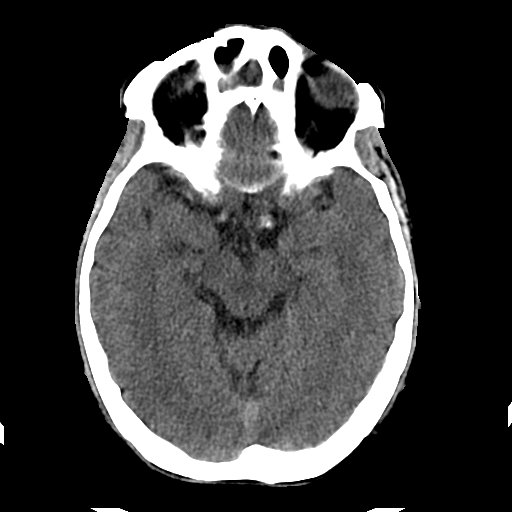
[im 12/32  bone]
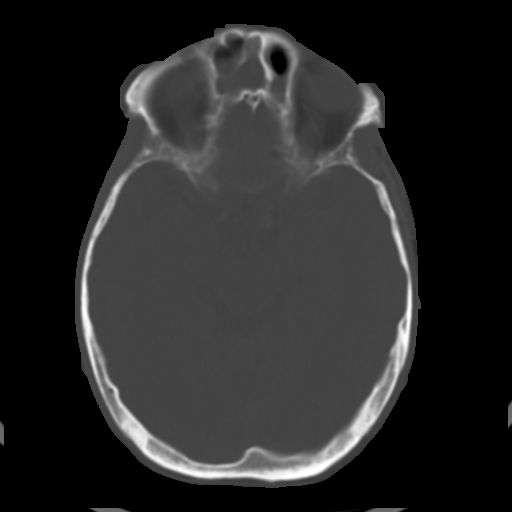
[im 14/32  brain]
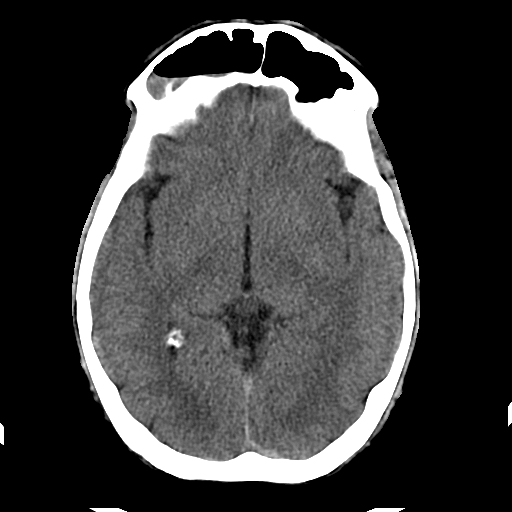
[im 16/32  brain]
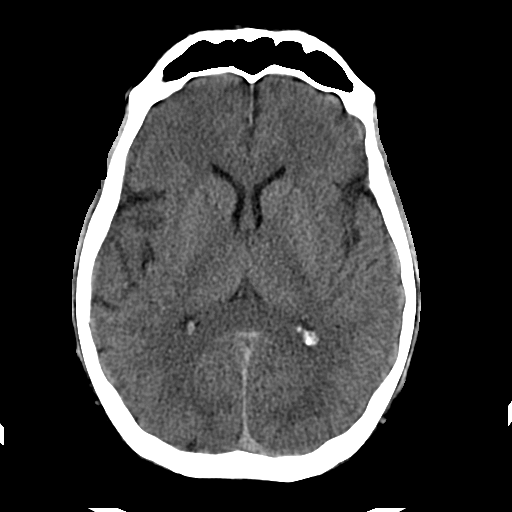
[im 18/32  brain]
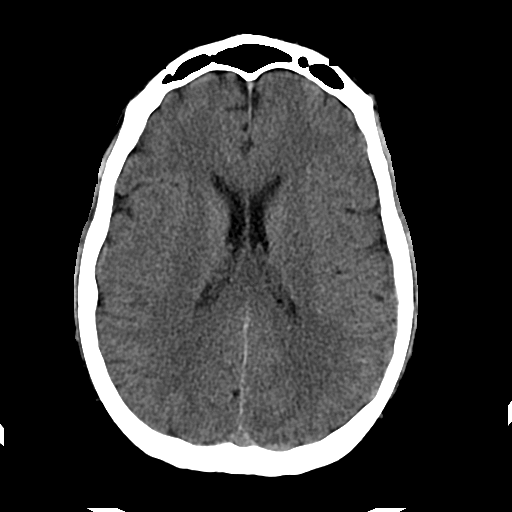
[im 20/32  brain]
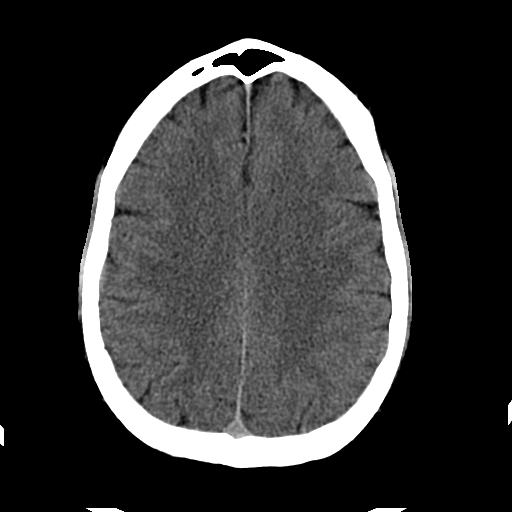
[im 20/32  bone]
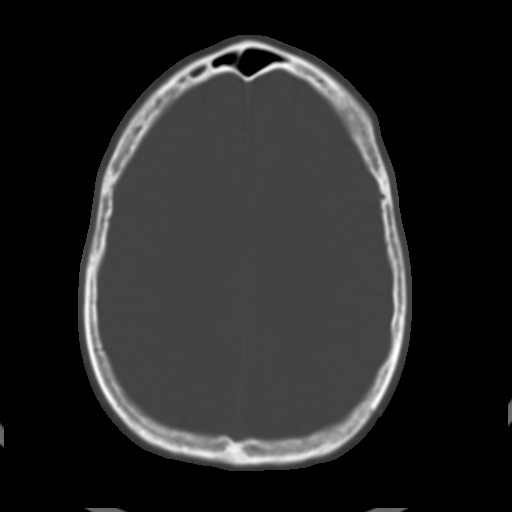
[im 23/32  brain]
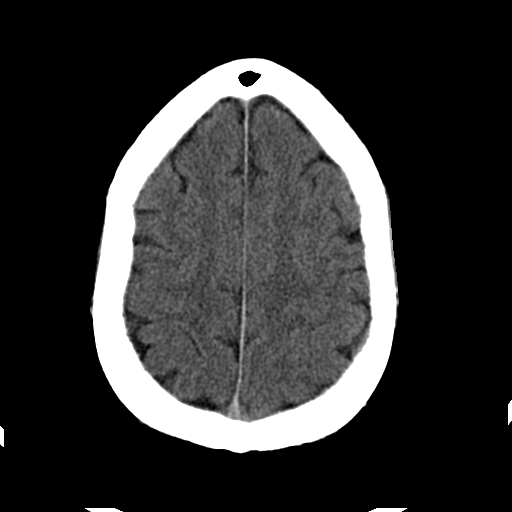
[im 25/32  brain]
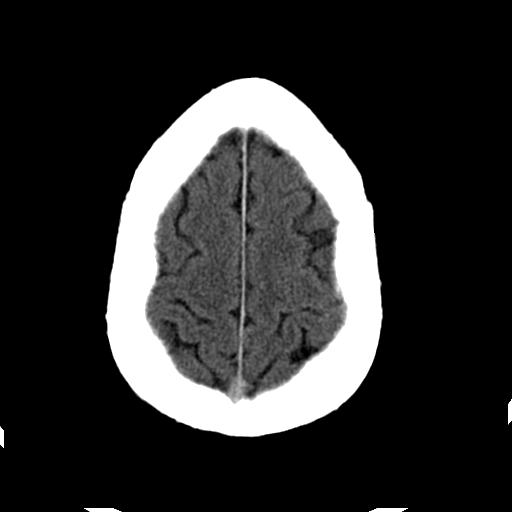
[im 27/32  brain]
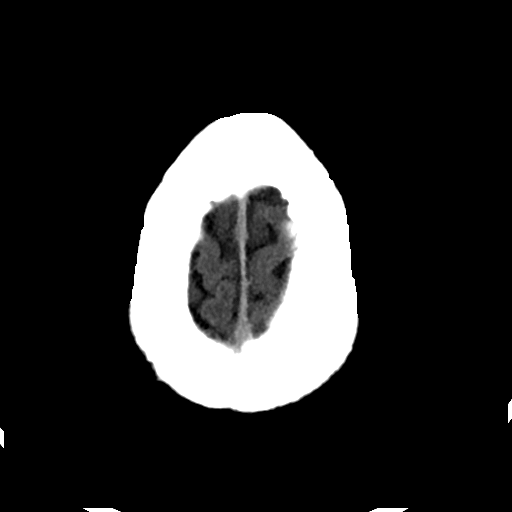
[im 29/32  brain]
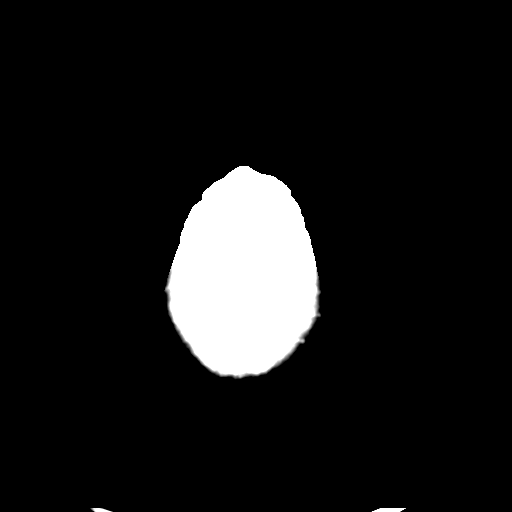
[im 29/32  bone]
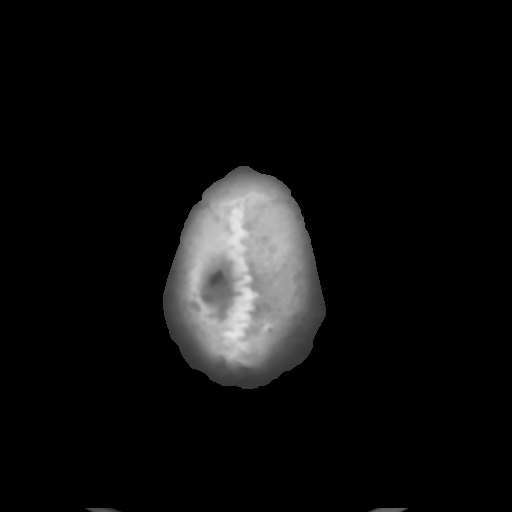

[Series 202: head w/o bone, idose (1) · axial · non-contrast · 0.40mm/px · z∈[+99,+144]mm · 3 of 32 slices shown]
[im 3/32  bone]
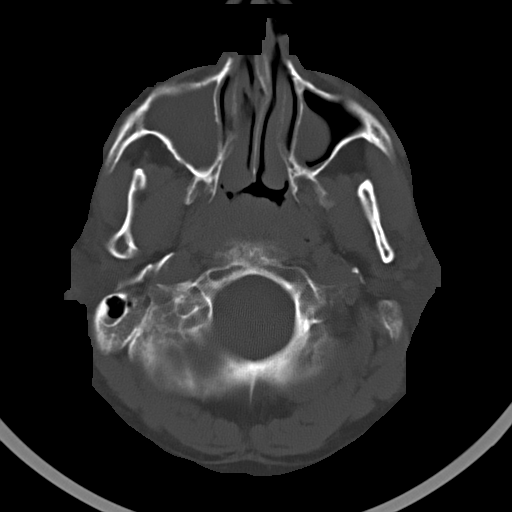
[im 7/32  bone]
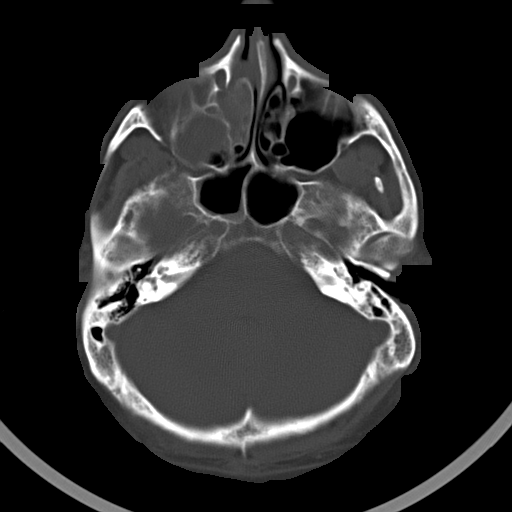
[im 12/32  bone]
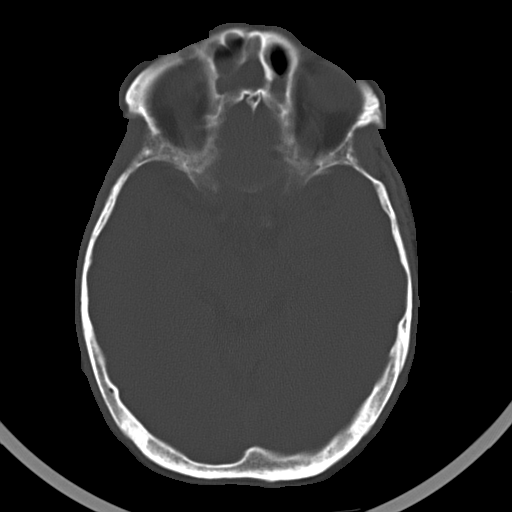

[16 of 30 positions shown; findings below may reference images not displayed]

FINDINGS: No acute intracranial abnormality. Specifically, no hemorrhage,
hydrocephalus, mass lesion, acute infarction, or significant
intracranial injury. No acute calvarial abnormality.

Complete opacification of the right maxillary sinus and anterior
ethmoid air cells. Air-fluid level noted in the right frontal sinus.
Rounded soft tissue in the floor the left maxillary sinus. Mastoid
air cells are clear.
IMPRESSION: No intracranial abnormality.

Extensive sinusitis with complete opacification of the right
maxillary sinus and anterior ethmoid air cells. Air-fluid level in
the right frontal sinus compatible with acute sinusitis.

## 2016-12-05 IMAGING — CR DG CHEST 2V
2 series · 2 of 2 positions shown · non-contrast
Comparison: 05/03/2010

CLINICAL DATA: Mid to left chest pain for 2 days. Cough, felt a
pop.

EXAM:
CHEST  2 VIEW

[chest pa]
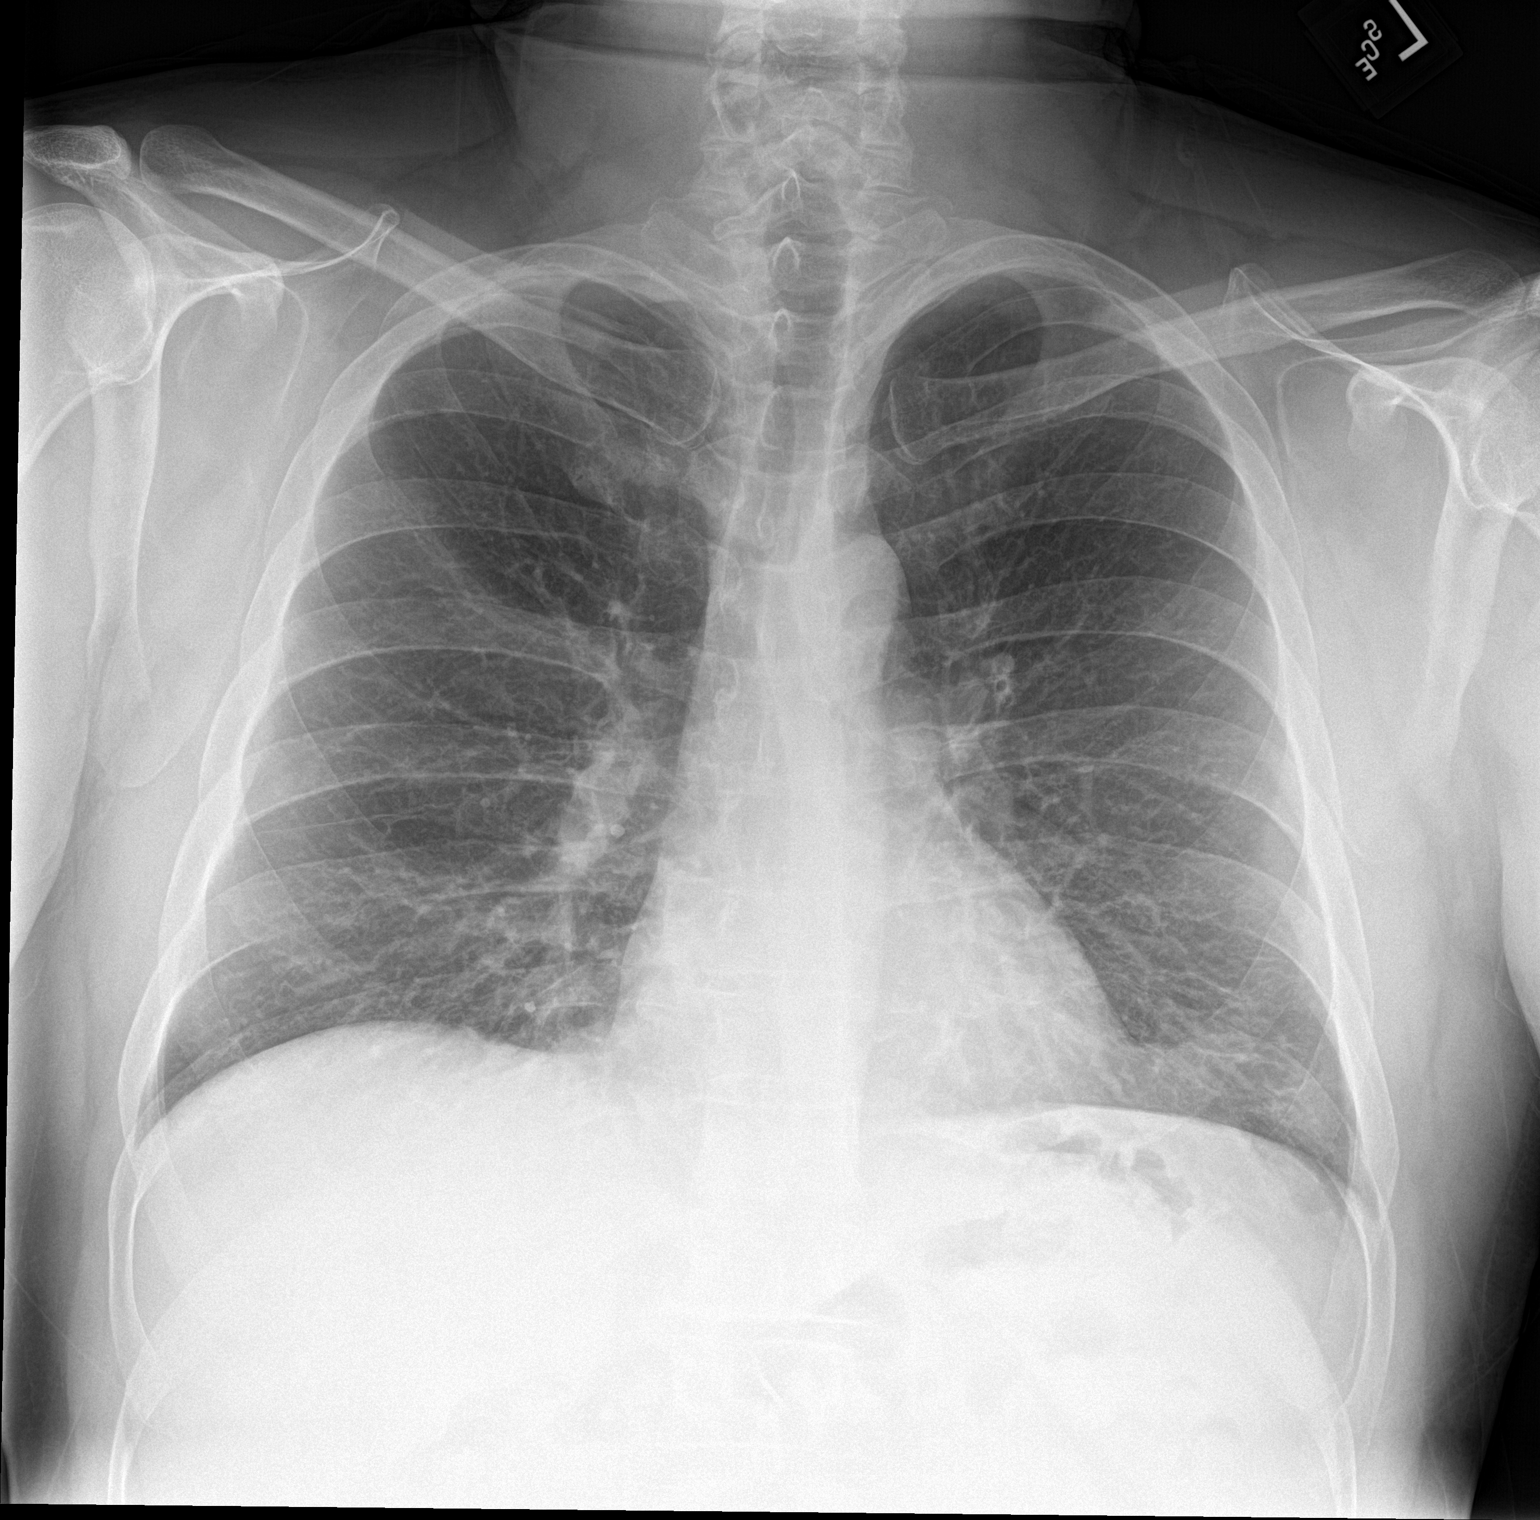

[chest lat]
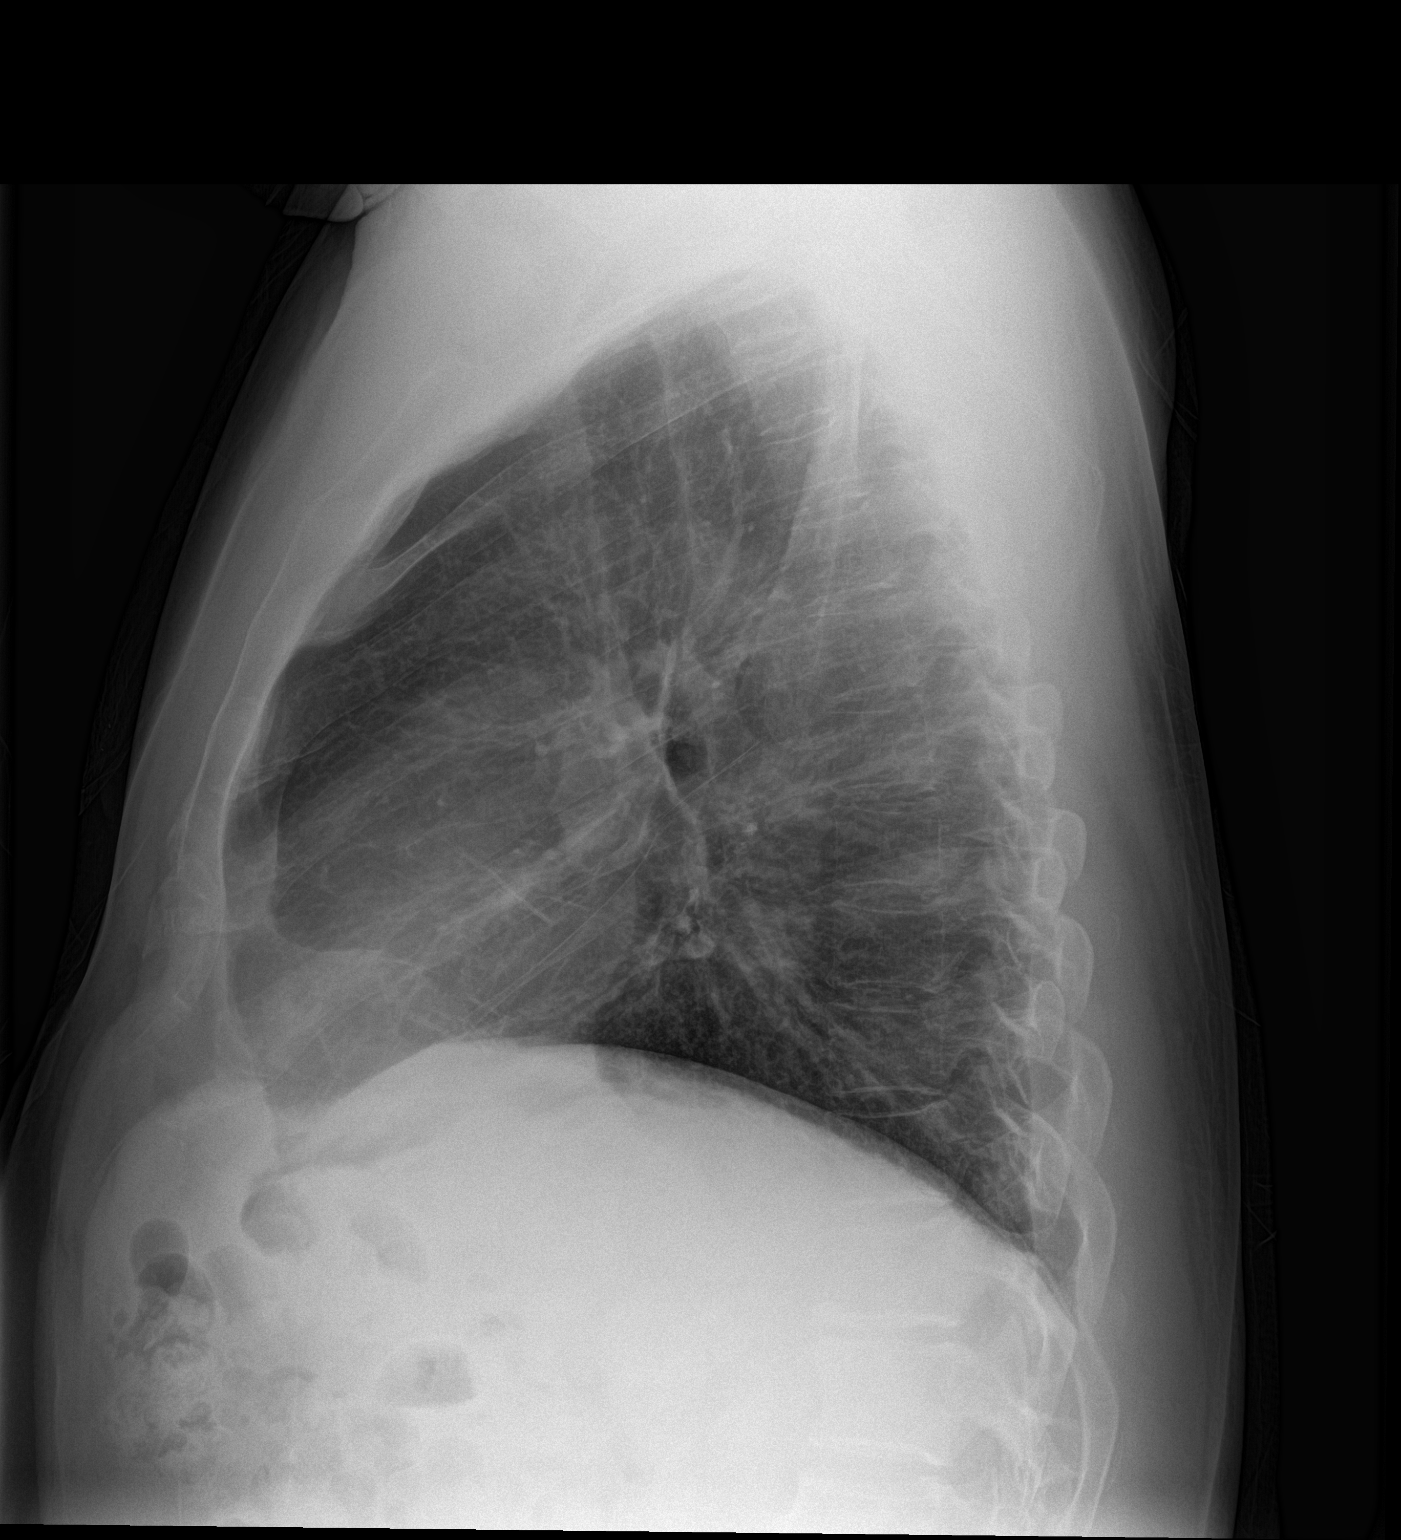

[2 of 2 positions shown; findings below may reference images not displayed]

FINDINGS: Heart and mediastinal contours are within normal limits. No focal
opacities or effusions. No acute bony abnormality.
IMPRESSION: No active cardiopulmonary disease.

## 2017-09-05 DIAGNOSIS — M179 Osteoarthritis of knee, unspecified: Secondary | ICD-10-CM | POA: Insufficient documentation

## 2017-10-11 DIAGNOSIS — Z96652 Presence of left artificial knee joint: Secondary | ICD-10-CM | POA: Insufficient documentation

## 2020-10-28 LAB — LAB REPORT - SCANNED: EGFR: 87

## 2021-02-26 LAB — LAB REPORT - SCANNED: EGFR: 92

## 2021-06-09 DIAGNOSIS — S86919A Strain of unspecified muscle(s) and tendon(s) at lower leg level, unspecified leg, initial encounter: Secondary | ICD-10-CM | POA: Insufficient documentation

## 2021-07-31 LAB — HM COLONOSCOPY

## 2021-10-28 LAB — LAB REPORT - SCANNED
A1c: 5.7
EGFR: 95

## 2022-05-03 DIAGNOSIS — E559 Vitamin D deficiency, unspecified: Secondary | ICD-10-CM | POA: Diagnosis not present

## 2022-05-03 DIAGNOSIS — Z1389 Encounter for screening for other disorder: Secondary | ICD-10-CM | POA: Diagnosis not present

## 2022-05-03 DIAGNOSIS — Z6832 Body mass index (BMI) 32.0-32.9, adult: Secondary | ICD-10-CM | POA: Diagnosis not present

## 2022-05-03 DIAGNOSIS — Z131 Encounter for screening for diabetes mellitus: Secondary | ICD-10-CM | POA: Diagnosis not present

## 2022-05-03 DIAGNOSIS — R7303 Prediabetes: Secondary | ICD-10-CM | POA: Diagnosis not present

## 2022-05-03 DIAGNOSIS — R42 Dizziness and giddiness: Secondary | ICD-10-CM | POA: Diagnosis not present

## 2022-05-03 LAB — LAB REPORT - SCANNED
A1c: 5.7
EGFR: 92

## 2022-07-21 DIAGNOSIS — Z6834 Body mass index (BMI) 34.0-34.9, adult: Secondary | ICD-10-CM | POA: Diagnosis not present

## 2022-07-21 DIAGNOSIS — S20212A Contusion of left front wall of thorax, initial encounter: Secondary | ICD-10-CM | POA: Diagnosis not present

## 2022-07-22 ENCOUNTER — Ambulatory Visit
Admission: RE | Admit: 2022-07-22 | Discharge: 2022-07-22 | Disposition: A | Discharge status: Home / Self Care | Payer: BC Managed Care – PPO | Source: Ambulatory Visit | Attending: Family Medicine | Admitting: Family Medicine

## 2022-07-22 ENCOUNTER — Other Ambulatory Visit: Payer: Self-pay | Admitting: Family Medicine

## 2022-07-22 DIAGNOSIS — R42 Dizziness and giddiness: Secondary | ICD-10-CM

## 2022-07-22 DIAGNOSIS — S20212A Contusion of left front wall of thorax, initial encounter: Secondary | ICD-10-CM | POA: Diagnosis not present

## 2022-07-22 DIAGNOSIS — S2242XA Multiple fractures of ribs, left side, initial encounter for closed fracture: Secondary | ICD-10-CM | POA: Diagnosis not present

## 2022-10-28 DIAGNOSIS — Z0000 Encounter for general adult medical examination without abnormal findings: Secondary | ICD-10-CM | POA: Diagnosis not present

## 2022-10-28 DIAGNOSIS — Z1211 Encounter for screening for malignant neoplasm of colon: Secondary | ICD-10-CM | POA: Diagnosis not present

## 2022-10-28 DIAGNOSIS — Z136 Encounter for screening for cardiovascular disorders: Secondary | ICD-10-CM | POA: Diagnosis not present

## 2022-10-28 DIAGNOSIS — F17210 Nicotine dependence, cigarettes, uncomplicated: Secondary | ICD-10-CM | POA: Diagnosis not present

## 2023-11-04 ENCOUNTER — Encounter: Payer: Self-pay | Admitting: General Practice

## 2023-11-04 ENCOUNTER — Ambulatory Visit: Payer: BC Managed Care – PPO | Admitting: General Practice

## 2023-11-04 VITALS — BP 134/84 | HR 76 | Temp 98.0°F | Ht 67.0 in | Wt 212.0 lb

## 2023-11-04 DIAGNOSIS — Z122 Encounter for screening for malignant neoplasm of respiratory organs: Secondary | ICD-10-CM

## 2023-11-04 DIAGNOSIS — Z0000 Encounter for general adult medical examination without abnormal findings: Secondary | ICD-10-CM | POA: Insufficient documentation

## 2023-11-04 DIAGNOSIS — M1712 Unilateral primary osteoarthritis, left knee: Secondary | ICD-10-CM

## 2023-11-04 DIAGNOSIS — J449 Chronic obstructive pulmonary disease, unspecified: Secondary | ICD-10-CM | POA: Insufficient documentation

## 2023-11-04 DIAGNOSIS — F17200 Nicotine dependence, unspecified, uncomplicated: Secondary | ICD-10-CM | POA: Insufficient documentation

## 2023-11-04 DIAGNOSIS — Z125 Encounter for screening for malignant neoplasm of prostate: Secondary | ICD-10-CM

## 2023-11-04 LAB — CBC
HCT: 49.6 % (ref 39.0–52.0)
Hemoglobin: 17.8 g/dL — ABNORMAL HIGH (ref 13.0–17.0)
MCHC: 35.9 g/dL (ref 30.0–36.0)
MCV: 97.5 fL (ref 78.0–100.0)
Platelets: 227 10*3/uL (ref 150.0–400.0)
RBC: 5.08 Mil/uL (ref 4.22–5.81)
RDW: 12.8 % (ref 11.5–15.5)
WBC: 6.4 10*3/uL (ref 4.0–10.5)

## 2023-11-04 LAB — COMPREHENSIVE METABOLIC PANEL
ALT: 21 U/L (ref 0–53)
AST: 18 U/L (ref 0–37)
Albumin: 4.7 g/dL (ref 3.5–5.2)
Alkaline Phosphatase: 96 U/L (ref 39–117)
BUN: 20 mg/dL (ref 6–23)
CO2: 30 meq/L (ref 19–32)
Calcium: 9.8 mg/dL (ref 8.4–10.5)
Chloride: 104 meq/L (ref 96–112)
Creatinine, Ser: 0.87 mg/dL (ref 0.40–1.50)
GFR: 97.46 mL/min (ref >60.00)
Glucose, Bld: 106 mg/dL — ABNORMAL HIGH (ref 70–99)
Potassium: 4.5 meq/L (ref 3.5–5.1)
Sodium: 139 meq/L (ref 135–145)
Total Bilirubin: 0.6 mg/dL (ref 0.2–1.2)
Total Protein: 7.5 g/dL (ref 6.0–8.3)

## 2023-11-04 LAB — LIPID PANEL
Cholesterol: 183 mg/dL (ref 0–200)
HDL: 40.7 mg/dL (ref >39.00)
LDL Cholesterol: 114 mg/dL — ABNORMAL HIGH (ref 0–99)
NonHDL: 142.66
Total CHOL/HDL Ratio: 5
Triglycerides: 143 mg/dL (ref 0.0–149.0)
VLDL: 28.6 mg/dL (ref 0.0–40.0)

## 2023-11-04 LAB — PSA: PSA: 0.33 ng/mL (ref 0.10–4.00)

## 2023-11-04 NOTE — Patient Instructions (Signed)
Stop by the lab prior to leaving today. I will notify you of your results once received.   It was a pleasure meeting you!  

## 2023-11-04 NOTE — Assessment & Plan Note (Signed)
Has been a smoker for 38 years with 1 PPD. Not ready to quit at this time.   Agreed to complete lung cancer screening.   Referral placed.

## 2023-11-04 NOTE — Assessment & Plan Note (Signed)
Immunizations - tetanus UTD. Declines, flu, pnuemovax, shingles, covid. Colonoscopy UTD. PSA due and pending.  Discussed the importance of a healthy diet and regular exercise in order for weight loss, and to reduce the risk of further co-morbidity.  Exam stable. Labs pending.  Follow up in 1 year for repeat physical.

## 2023-11-04 NOTE — Assessment & Plan Note (Signed)
Stable.  Uses Ibuprofen has needed for pain.

## 2023-11-04 NOTE — Assessment & Plan Note (Signed)
Per patient, he has a history of COPD. He is a current smoker.  Reviewed last chest x-ray from 2017 in mychart. - normal Reviewed last spirometry results from 2021 in my chart. - normal.   Continue Albuterol inhaler as needed.

## 2023-11-04 NOTE — Progress Notes (Signed)
Please call patient.   CBC looks within normal limits. Hemoglobin is elevated, which can be caused by smoking. We will continue to monitor.   Electrolytes, kidney functions and liver functions are within normal limits. Glucose a little elevated. Is there any known family history of diabetes?   Cholesterol- the bad cholesterol is elevated at 114. Goal is to be below 99. The good cholesterol is borderline normal, which has decreased from before. Please continue to monitor diet and exercise.

## 2023-11-04 NOTE — Assessment & Plan Note (Signed)
Stable.   Uses ibuprofen as needed for pain.

## 2023-11-04 NOTE — Progress Notes (Signed)
New Patient Office Visit  Subjective    Patient ID: Randy Roman, male    DOB: 1966/09/03  Age: 57 y.o. MRN: [id number]  CC:  Chief Complaint  Patient presents with   New Patient (Initial Visit)    Needs CPE if possible today for work.     HPI Randy Roman is a 57 y.o. male presents to establish care and for complete physical.  Last physical: November 2023 Last PCP: Randy Kearns, MD Family history: adopted  Immunizations: -Tetanus: Completed in 2020 -Influenza: declined -Shingles: declines Shingrix series  Diet: Fair diet.  Exercise: regular exercise. Yard work, Education officer, environmental [employer], walking at work.  Eye exam: Completes annually  Dental exam: hasn't been this year.   Colonoscopy: Completed in 2022. Due in 2025.  Lung Cancer Screening: Never. History of smoking and COPD. He has albuterol at home but uses it sparingly. Pack a day. Has been smoking since he was 19.   PSA: Due    Outpatient Encounter Medications as of 11/04/2023  Medication Sig   albuterol (VENTOLIN HFA) 108 (90 Base) MCG/ACT inhaler Inhale 2 puffs into the lungs every 4 (four) hours as needed.   [DISCONTINUED] doxycycline (VIBRAMYCIN) 100 MG capsule Take 1 capsule (100 mg total) by mouth 2 (two) times daily.   [DISCONTINUED] ibuprofen (ADVIL,MOTRIN) 200 MG tablet Take 800 mg by mouth every 6 (six) hours as needed for headache.   [DISCONTINUED] naproxen (NAPROSYN) 500 MG tablet Take 1 tablet (500 mg total) by mouth 2 (two) times daily with a meal.   [DISCONTINUED] oxyCODONE-acetaminophen (PERCOCET/ROXICET) 5-325 MG tablet 1 to 2 tabs PO q6hrs  PRN for pain   [DISCONTINUED] predniSONE (DELTASONE) 50 MG tablet Take 1 tablet daily with breakfast   [DISCONTINUED] Protein POWD Take 1 scoop by mouth daily as needed (FOR WORKING OUT).   No facility-administered encounter medications on file as of 11/04/2023.    Past Medical History:  Diagnosis Date   Chicken pox    Migraine    Rheumatic fever      Past Surgical History:  Procedure Laterality Date   MEDIAL PARTIAL KNEE REPLACEMENT Left    thinks it was 2-3 years ago; thinks it was at 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  Tobacco Use   Smoking status: Every Day    Current packs/day: 1.00    Average packs/day: 1 pack/day for 38.9 years (38.9 ttl pk-yrs)    Types: Cigarettes    Start date: 28   Smokeless tobacco: Never  Vaping Use   Vaping status: Never Used  Substance and Sexual Activity   Alcohol use: Yes    Alcohol/week: 11.0 standard drinks of alcohol    Types: 10 Cans of beer, 1 Shots of liquor per week   Drug use: Not Currently    Types: Cocaine, Marijuana    Comment: long time ago   Sexual activity: Yes    Birth control/protection: Condom  Other Topics Concern   Not on file  Social History Narrative   2 boys and a girl.    He is adopted.    Social Determinants of Health  Financial Resource Strain: Not on file  Food Insecurity: Not on file  Transportation Needs: Not on file  Physical Activity: Not on file  Stress: Not on file  Social Connections: Not on file  Intimate Partner Violence: Not on file    Review of Systems  Constitutional:  Negative for chills, fever, malaise/fatigue and weight loss.  HENT:  Negative for congestion, ear discharge, ear pain, hearing loss, nosebleeds, sinus pain, sore throat and tinnitus.   Eyes:  Negative for blurred vision, double vision, pain, discharge and redness.  Respiratory:  Positive for wheezing. Negative for cough, shortness of breath and stridor.   Cardiovascular:  Negative for chest pain, palpitations and leg swelling.  Gastrointestinal:   Negative for abdominal pain, constipation, diarrhea, heartburn, nausea and vomiting.  Genitourinary:  Negative for dysuria, frequency and urgency.  Musculoskeletal:  Negative for myalgias.  Skin:  Negative for rash.  Neurological:  Negative for dizziness, tingling, seizures, weakness and headaches.  Psychiatric/Behavioral:  Negative for depression, substance abuse and suicidal ideas. The patient is not nervous/anxious.         Objective    BP 134/84 (BP Location: Left Arm, Patient Position: Sitting, Cuff Size: Normal)   Pulse 76   Temp 98 F (36.7 C) (Oral)   Ht 5\' 7"  (1.702 m)   Wt 212 lb (96.2 kg)   SpO2 98%   BMI 33.20 kg/m   Physical Exam Vitals and nursing note reviewed.  Constitutional:      Appearance: Normal appearance.  HENT:     Head: Normocephalic and atraumatic.     Right Ear: Tympanic membrane, ear canal and external ear normal.     Left Ear: Tympanic membrane, ear canal and external ear normal.     Nose: Nose normal.     Mouth/Throat:     Mouth: Mucous membranes are moist.     Pharynx: Oropharynx is clear.  Eyes:     Conjunctiva/sclera: Conjunctivae normal.     Pupils: Pupils are equal, round, and reactive to light.  Cardiovascular:     Rate and Rhythm: Normal rate and regular rhythm.     Pulses: Normal pulses.     Heart sounds: Normal heart sounds.  Pulmonary:     Effort: Pulmonary effort is normal.     Breath sounds: Normal breath sounds.  Abdominal:     General: Abdomen is flat. Bowel sounds are normal.     Palpations: Abdomen is soft.  Musculoskeletal:        General: Normal range of motion.     Cervical back: Normal range of motion.  Skin:    General: Skin is warm and dry.     Capillary Refill: Capillary refill takes less than 2 seconds.  Neurological:     General: No focal deficit present.     Mental Status: He is alert and oriented to person, place, and time. Mental status is at baseline.  Psychiatric:        Mood and Affect: Mood normal.         Behavior: Behavior normal.        Thought Content: Thought content normal.        Judgment: Judgment normal.         Assessment & Plan:  Encounter for screening and preventative care Assessment & Plan: Immunizations - tetanus UTD. Declines, flu, pnuemovax, shingles, covid. Colonoscopy UTD. PSA due and pending.  Discussed the importance of a healthy diet and regular exercise in order for weight loss, and to  reduce the risk of further co-morbidity.  Exam stable. Labs pending.  Follow up in 1 year for repeat physical.   Orders: -     CBC -     Comprehensive metabolic panel -     Lipid panel -     PSA  Encounter for screening for malignant neoplasm of lung in current smoker with 30 pack year history or greater Assessment & Plan: Has been a smoker for 38 years with 1 PPD. Not ready to quit at this time.   Agreed to complete lung cancer screening.   Referral placed.  Orders: -     Ambulatory Referral for Lung Cancer Scre  Chronic obstructive pulmonary disease, unspecified COPD type (HCC) Assessment & Plan: Per patient, he has a history of COPD. He is a current smoker.  Reviewed last chest x-ray from 2017 in mychart. - normal Reviewed last spirometry results from 2021 in my chart. - normal.   Continue Albuterol inhaler as needed.   Osteoarthritis of left knee, unspecified osteoarthritis type Assessment & Plan: Stable.   Uses ibuprofen as needed for pain.      Return in about 1 year (around 11/03/2024) for physical.   Modesto Charon, NP

## 2024-06-18 ENCOUNTER — Encounter: Payer: Self-pay | Admitting: Internal Medicine

## 2024-06-18 ENCOUNTER — Ambulatory Visit: Payer: Self-pay

## 2024-06-18 ENCOUNTER — Ambulatory Visit: Admitting: Internal Medicine

## 2024-06-18 VITALS — BP 130/84 | HR 109 | Temp 98.7°F | Ht 67.0 in | Wt 212.0 lb

## 2024-06-18 DIAGNOSIS — S7011XA Contusion of right thigh, initial encounter: Secondary | ICD-10-CM | POA: Insufficient documentation

## 2024-06-18 NOTE — Progress Notes (Signed)
   Subjective:    Patient ID: Randy Roman, male    DOB: Dec 18, 1965, 58 y.o.   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     No current facility-administered medications on file prior to visit.    Allergies  Allergen Reactions   Penicillins Other (See Comments)    CHILDHOOD ALLERGY   Asa [Aspirin] Other (See Comments)    RHEUMATIC FEVER AS A CHILD    Past Medical History:  Diagnosis Date   Chicken pox    Migraine    Rheumatic fever     Past Surgical History:  Procedure Laterality Date   MEDIAL PARTIAL KNEE REPLACEMENT Left    thinks it was 2-3 years ago; thinks it was at 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  Tobacco Use   Smoking status: Every Day    Current packs/day: 1.00    Average packs/day: 1 pack/day for 39.6 years (39.6 ttl pk-yrs)    Types: Cigarettes    Start date: 60   Smokeless tobacco: Never  Vaping Use   Vaping status: Never Used  Substance and Sexual Activity   Alcohol use: Yes    Alcohol/week: 11.0  standard drinks of alcohol    Types: 10 Cans of beer, 1 Shots of liquor per week   Drug use: Not Currently    Types: Cocaine, Marijuana    Comment: long time ago   Sexual activity: Yes    Birth control/protection: Condom  Other Topics Concern   Not on file  Social History Narrative   2 boys and a girl.    He is adopted.    Social Drivers of Corporate investment banker Strain: Not on file  Food Insecurity: Not on file  Transportation Needs: Not on file  Physical Activity: Not on file  Stress: Not on file  Social Connections: Not on file  Intimate Partner Violence: Not on file   Review of Systems Can sleep but has pain when he turns onto the right side Voids okay Bowels move okay     Objective:   Physical Exam Musculoskeletal:     Comments: No back/spine tenderness No right hip tenderness---and normal ROM Tenderness and ecchymoses along lateral right thigh---about 10cm below the hip  Neurological:     Comments: Mildly antalgic gait No leg weakness            Assessment & Plan:
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
## 2024-06-18 NOTE — Assessment & Plan Note (Signed)
 Reassured--doesn't seem to involve bone at all Could increase the ibuprofen to 600-800mg  bid (especially before work) Can ice after work if painful--heat otherwise

## 2024-09-07 DIAGNOSIS — M25461 Effusion, right knee: Secondary | ICD-10-CM | POA: Diagnosis not present

## 2024-09-24 DIAGNOSIS — M25461 Effusion, right knee: Secondary | ICD-10-CM | POA: Diagnosis not present

## 2024-09-24 DIAGNOSIS — M1711 Unilateral primary osteoarthritis, right knee: Secondary | ICD-10-CM | POA: Diagnosis not present

## 2024-11-05 ENCOUNTER — Encounter: Payer: Self-pay | Admitting: General Practice

## 2024-11-05 ENCOUNTER — Ambulatory Visit: Payer: BC Managed Care – PPO | Admitting: General Practice

## 2024-11-05 ENCOUNTER — Ambulatory Visit: Payer: Self-pay | Admitting: General Practice

## 2024-11-05 VITALS — BP 138/86 | HR 95 | Temp 98.1°F | Ht 67.0 in | Wt 220.0 lb

## 2024-11-05 DIAGNOSIS — Z23 Encounter for immunization: Secondary | ICD-10-CM

## 2024-11-05 DIAGNOSIS — Z1159 Encounter for screening for other viral diseases: Secondary | ICD-10-CM

## 2024-11-05 DIAGNOSIS — J449 Chronic obstructive pulmonary disease, unspecified: Secondary | ICD-10-CM

## 2024-11-05 DIAGNOSIS — Z125 Encounter for screening for malignant neoplasm of prostate: Secondary | ICD-10-CM | POA: Diagnosis not present

## 2024-11-05 DIAGNOSIS — E7800 Pure hypercholesterolemia, unspecified: Secondary | ICD-10-CM | POA: Diagnosis not present

## 2024-11-05 DIAGNOSIS — F17210 Nicotine dependence, cigarettes, uncomplicated: Secondary | ICD-10-CM

## 2024-11-05 DIAGNOSIS — S7011XS Contusion of right thigh, sequela: Secondary | ICD-10-CM

## 2024-11-05 DIAGNOSIS — H6122 Impacted cerumen, left ear: Secondary | ICD-10-CM | POA: Insufficient documentation

## 2024-11-05 DIAGNOSIS — Z0000 Encounter for general adult medical examination without abnormal findings: Secondary | ICD-10-CM

## 2024-11-05 DIAGNOSIS — Z0001 Encounter for general adult medical examination with abnormal findings: Secondary | ICD-10-CM | POA: Diagnosis not present

## 2024-11-05 DIAGNOSIS — M1731 Unilateral post-traumatic osteoarthritis, right knee: Secondary | ICD-10-CM

## 2024-11-05 LAB — COMPREHENSIVE METABOLIC PANEL WITH GFR
ALT: 17 U/L (ref 0–53)
AST: 16 U/L (ref 0–37)
Albumin: 4.5 g/dL (ref 3.5–5.2)
Alkaline Phosphatase: 101 U/L (ref 39–117)
BUN: 13 mg/dL (ref 6–23)
CO2: 28 meq/L (ref 19–32)
Calcium: 9.8 mg/dL (ref 8.4–10.5)
Chloride: 102 meq/L (ref 96–112)
Creatinine, Ser: 0.87 mg/dL (ref 0.40–1.50)
GFR: 96.78 mL/min
Glucose, Bld: 106 mg/dL — ABNORMAL HIGH (ref 70–99)
Potassium: 4.2 meq/L (ref 3.5–5.1)
Sodium: 139 meq/L (ref 135–145)
Total Bilirubin: 0.4 mg/dL (ref 0.2–1.2)
Total Protein: 7.1 g/dL (ref 6.0–8.3)

## 2024-11-05 LAB — LIPID PANEL
Cholesterol: 175 mg/dL (ref 0–200)
HDL: 51.3 mg/dL
LDL Cholesterol: 99 mg/dL (ref 0–99)
NonHDL: 123.43
Total CHOL/HDL Ratio: 3
Triglycerides: 123 mg/dL (ref 0.0–149.0)
VLDL: 24.6 mg/dL (ref 0.0–40.0)

## 2024-11-05 LAB — CBC
HCT: 45.4 % (ref 39.0–52.0)
Hemoglobin: 15.7 g/dL (ref 13.0–17.0)
MCHC: 34.7 g/dL (ref 30.0–36.0)
MCV: 96.2 fl (ref 78.0–100.0)
Platelets: 256 K/uL (ref 150.0–400.0)
RBC: 4.72 Mil/uL (ref 4.22–5.81)
RDW: 13.6 % (ref 11.5–15.5)
WBC: 6.5 K/uL (ref 4.0–10.5)

## 2024-11-05 LAB — HEMOGLOBIN A1C: Hgb A1c MFr Bld: 5.4 % (ref 4.6–6.5)

## 2024-11-05 LAB — TSH: TSH: 1.36 u[IU]/mL (ref 0.35–5.50)

## 2024-11-05 LAB — PSA: PSA: 0.35 ng/mL (ref 0.10–4.00)

## 2024-11-05 MED ORDER — IBUPROFEN 800 MG PO TABS: 800.0000 mg | ORAL_TABLET | Freq: Three times a day (TID) | ORAL | 0 refills | Status: AC | PRN

## 2024-11-05 MED ORDER — ALBUTEROL SULFATE HFA 108 (90 BASE) MCG/ACT IN AERS: 2.0000 | INHALATION_SPRAY | RESPIRATORY_TRACT | 2 refills | Status: AC | PRN

## 2024-11-05 NOTE — Assessment & Plan Note (Signed)
 Immunizations tdap is UTD; prevnar 20 given today. declines flu, and shingles vaccine. Colonoscopy UTD,  PSA due and pending.  Discussed the importance of a healthy diet and regular exercise in order for weight loss, and to reduce the risk of further co-morbidity.  Exam stable. Labs pending.  Follow up in 1 year for repeat physical.

## 2024-11-05 NOTE — Assessment & Plan Note (Signed)
 Controlled.  Referral placed for lung cancer CT.   Continue Albuterol  inhaler. Refill sent.

## 2024-11-05 NOTE — Patient Instructions (Addendum)
 Stop by the lab prior to leaving today. I will notify you of your results once received.   As discussed cut back on smoking.   You will either be contacted via phone regarding your referral to pulmonology, or you may receive a letter on your MyChart portal from our referral team with instructions for scheduling an appointment. Please let us  know if you have not been contacted by anyone within two weeks.   Use Debrox drops in the left year.   Follow up in one year for physical.   It was a pleasure to see you today!

## 2024-11-05 NOTE — Assessment & Plan Note (Signed)
 Controlled.  Continue Ibuprofen  800 mg as needed.  Discussed using elevation and use ice as tolerated.

## 2024-11-05 NOTE — Assessment & Plan Note (Addendum)
 Left ear cerumen impaction identified on exam. Patient consented to irrigation of left canal .  Left canal irrigated. Patient tolerated well. TM and canal post irrigation- still has cerumen; unable to visualize TM.   Discussed home care instructions.   F/u in 1-2 weeks for irrigation.

## 2024-11-05 NOTE — Progress Notes (Signed)
 Established Patient Office Visit  Subjective   Patient ID: Randy Roman, male    DOB: 1966/05/04  Age: 58 y.o. MRN: [id number]  Chief Complaint  Patient presents with   Annual Exam    With fasting labs    HPI  Regina ANTOLIN is a 58 year old male with past medical history of COPD, OA presents today for complete physical and follow up of chronic conditions.  Immunizations: -Tetanus: Completed in 2020 -Influenza: due this season. -Shingles: due this season -Pneumonia: due this season  Diet: Fair diet.  Exercise: No regular exercise.  Eye exam: Completes annually  Dental exam: Completes semi-annually    Colonoscopy: Completed in 2022. Lung Cancer Screening: due PSA: due   Patient Active Problem List   Diagnosis Date Noted   Impacted cerumen of left ear 11/05/2024   Contusion of right thigh 06/18/2024   Encounter for screening for malignant neoplasm of lung in current smoker with 30 pack year history or greater 11/04/2023   Encounter for screening and preventative care 11/04/2023   Chronic obstructive pulmonary disease (HCC) 11/04/2023   History of total knee arthroplasty, left 10/11/2017   Osteoarthritis of knee 09/05/2017   Past Medical History:  Diagnosis Date   Chicken pox    Migraine    Rheumatic fever    Past Surgical History:  Procedure Laterality Date   MEDIAL PARTIAL KNEE REPLACEMENT Left    thinks it was 2-3 years ago; thinks it was at [facility]   TOOTH EXTRACTION     Allergies  Allergen Reactions   Penicillins Other (See Comments)    CHILDHOOD ALLERGY   Asa [Aspirin] Other (See Comments)    RHEUMATIC FEVER AS A CHILD         11/05/2024    9:03 AM 06/18/2024    3:21 PM 11/04/2023    8:39 AM  Depression screen PHQ 2/9  Decreased Interest 0 0 0  Down, Depressed, Hopeless 0 0 0  PHQ - 2 Score 0 0 0  Altered sleeping 0  0  Tired, decreased energy 0  0  Change in appetite 0  0  Feeling bad or failure about yourself  0  0   Trouble concentrating 0  0  Moving slowly or fidgety/restless 0  0  Suicidal thoughts 0  0  PHQ-9 Score 0  0   Difficult doing work/chores Not difficult at all  Not difficult at all     Data saved with a previous flowsheet row definition       11/05/2024    9:03 AM 11/04/2023    8:39 AM  GAD 7 : Generalized Anxiety Score  Nervous, Anxious, on Edge 0 0  Control/stop worrying 0 0  Worry too much - different things 0 0  Trouble relaxing 0 0  Restless 0 0  Easily annoyed or irritable 0 0  Afraid - awful might happen 0 0  Total GAD 7 Score 0 0  Anxiety Difficulty Not difficult at all Not difficult at all      Review of Systems  Constitutional:  Negative for chills, fever, malaise/fatigue and weight loss.  HENT:  Negative for congestion, ear discharge, ear pain, hearing loss, nosebleeds, sinus pain, sore throat and tinnitus.   Eyes:  Negative for blurred vision, double vision, pain, discharge and redness.  Respiratory:  Negative for cough, shortness of breath, wheezing and stridor.   Cardiovascular:  Negative for chest pain, palpitations and leg swelling.  Gastrointestinal:  Negative  for abdominal pain, constipation, diarrhea, heartburn, nausea and vomiting.  Genitourinary:  Negative for dysuria, frequency and urgency.  Musculoskeletal:  Negative for myalgias.  Skin:  Negative for rash.  Neurological:  Negative for dizziness, tingling, seizures, weakness and headaches.  Psychiatric/Behavioral:  Negative for depression, substance abuse and suicidal ideas. The patient is not nervous/anxious.       Objective:     BP 138/86   Pulse 95   Temp 98.1 F (36.7 C) (Temporal)   Ht 5' 7 (1.702 m)   Wt 220 lb (99.8 kg)   SpO2 97%   BMI 34.46 kg/m  BP Readings from Last 3 Encounters:  11/05/24 138/86  06/18/24 130/84  11/04/23 134/84   Wt Readings from Last 3 Encounters:  11/05/24 220 lb (99.8 kg)  06/18/24 212 lb (96.2 kg)  11/04/23 212 lb (96.2 kg)      Physical  Exam Vitals and nursing note reviewed.  Constitutional:      Appearance: Normal appearance.  HENT:     Head: Normocephalic and atraumatic.     Right Ear: Tympanic membrane, ear canal and external ear normal.     Left Ear: Tympanic membrane, ear canal and external ear normal.     Nose: Nose normal.     Mouth/Throat:     Mouth: Mucous membranes are moist.     Pharynx: Oropharynx is clear.  Eyes:     Conjunctiva/sclera: Conjunctivae normal.     Pupils: Pupils are equal, round, and reactive to light.  Cardiovascular:     Rate and Rhythm: Normal rate and regular rhythm.     Pulses: Normal pulses.     Heart sounds: Normal heart sounds.  Pulmonary:     Effort: Pulmonary effort is normal.     Breath sounds: Normal breath sounds.  Abdominal:     General: Abdomen is flat. Bowel sounds are normal.     Palpations: Abdomen is soft.  Musculoskeletal:        General: Normal range of motion.     Cervical back: Normal range of motion.  Skin:    General: Skin is warm and dry.     Capillary Refill: Capillary refill takes less than 2 seconds.  Neurological:     General: No focal deficit present.     Mental Status: He is alert and oriented to person, place, and time. Mental status is at baseline.  Psychiatric:        Mood and Affect: Mood normal.        Behavior: Behavior normal.        Thought Content: Thought content normal.        Judgment: Judgment normal.      No results found for any visits on 11/05/24.     The 10-year ASCVD risk score (Arnett DK, et al., 2019) is: 13.4%    Assessment & Plan:  Encounter for screening and preventative care Assessment & Plan: Immunizations tdap is UTD; prevnar 20 given today. declines flu, and shingles vaccine. Colonoscopy UTD,  PSA due and pending.  Discussed the importance of a healthy diet and regular exercise in order for weight loss, and to reduce the risk of further co-morbidity.  Exam stable. Labs pending.  Follow up in 1 year for  repeat physical.   Orders: -     Comprehensive metabolic panel with GFR -     CBC -     Lipid panel -     TSH  Encounter for screening for malignant neoplasm  of lung in current smoker with 20 pack year history or greater -     Pulmonary Visit  Elevated LDL cholesterol level -     Hemoglobin A1c  Prostate cancer screening -     PSA  Need for hepatitis C screening test -     Hepatitis C antibody  Chronic obstructive pulmonary disease, unspecified COPD type (HCC) Assessment & Plan: Controlled.  Referral placed for lung cancer CT.   Continue Albuterol  inhaler. Refill sent.  Orders: -     Albuterol  Sulfate HFA; Inhale 2 puffs into the lungs every 4 (four) hours as needed.  Dispense: 6.7 g; Refill: 2  Post-traumatic osteoarthritis of right knee -     Ibuprofen ; Take 1 tablet (800 mg total) by mouth every 8 (eight) hours as needed.  Dispense: 30 tablet; Refill: 0  Contusion of right thigh, sequela Assessment & Plan: Controlled.  Continue Ibuprofen  800 mg as needed.  Discussed using elevation and use ice as tolerated.   Impacted cerumen of left ear Assessment & Plan: Left ear cerumen impaction identified on exam. Patient consented to irrigation of left canal .  Left canal irrigated. Patient tolerated well. TM and canal post irrigation- still has cerumen; unable to visualize TM.   Discussed home care instructions.   F/u in 1-2 weeks for irrigation.    Encounter for immunization -     Pneumococcal conjugate vaccine 20-valent    Return in about 1 year (around 11/05/2025) for physical and fasting labs.SABRA Carrol Aurora, NP

## 2024-11-06 LAB — HEPATITIS C ANTIBODY: Hepatitis C Ab: NONREACTIVE

## 2024-11-15 ENCOUNTER — Ambulatory Visit: Admitting: Nurse Practitioner

## 2024-11-15 VITALS — BP 138/88 | HR 80 | Temp 98.0°F | Ht 67.0 in | Wt 222.0 lb

## 2024-11-15 DIAGNOSIS — N5089 Other specified disorders of the male genital organs: Secondary | ICD-10-CM

## 2024-11-15 DIAGNOSIS — L304 Erythema intertrigo: Secondary | ICD-10-CM | POA: Diagnosis not present

## 2024-11-15 DIAGNOSIS — L0291 Cutaneous abscess, unspecified: Secondary | ICD-10-CM

## 2024-11-15 MED ORDER — DOXYCYCLINE HYCLATE 100 MG PO TABS: 100.0000 mg | ORAL_TABLET | Freq: Two times a day (BID) | ORAL | 0 refills | Status: AC

## 2024-11-15 NOTE — Progress Notes (Signed)
 Established Patient Office Visit  Subjective   Patient ID: Randy Roman, male    DOB: [birth year]  Age: 58 y.o. MRN: [id number]  Chief Complaint  Patient presents with   Cyst    Pt complains of cyst located on groin area. States of fluid leakage. Non painful cyst.     HPI   With a history of COPD  Discussed the use of AI scribe software for clinical note transcription with the patient, who gave verbal consent to proceed.  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  He has been experiencing recurrent cysts in the groin and scrotal area, with the current episode beginning approximately one month ago. The cyst is located on the right side of the scrotum and has been leaking fluid, which has helped reduce its size. He has had similar cysts in the past, with three prior I&D interventions and one resolved with medication. The current cyst has caused significant discomfort, with leakage that wets his underwear at work, requiring him to use toilet paper for management. The discharge varies in color, sometimes appearing bloody. Pain is associated with the amount of discharge, causing rawness in the area. No lower abdominal pain, difficulty urinating, or changes in testicular size. He also denies feeling any lumps in the inguinal area, fever, or chills.  His current medications include a multivitamin, ibuprofen  as needed, and an albuterol  inhaler. He has a known allergy to penicillin and a history of rheumatic fever as a child, which is relevant to his aspirin allergy.  Socially, he works as a electrical engineer and continues to smoke.       Review of Systems  Constitutional:  Negative for chills and fever.  Respiratory:  Negative for shortness of breath.   Cardiovascular:  Negative for chest pain.  Gastrointestinal:  Negative for abdominal pain, nausea and vomiting.  Genitourinary:   Negative for dysuria.  Skin:        Lesion +      Objective:     BP 138/88   Pulse 80   Temp 98 F (36.7 C) (Oral)   Ht 5' 7 (1.702 m)   Wt 222 lb (100.7 kg)   SpO2 96%   BMI 34.77 kg/m    Physical Exam Vitals and nursing note reviewed. Exam conducted with a chaperone present Devere Hummer, CMA).  Constitutional:      Appearance: Normal appearance.  Cardiovascular:     Rate and Rhythm: Normal rate and regular rhythm.     Heart sounds: Normal heart sounds.  Pulmonary:     Effort: Pulmonary effort is normal.     Breath sounds: Normal breath sounds.  Abdominal:     General: Bowel sounds are normal. There is no distension.     Palpations: There is no mass.     Tenderness: There is no abdominal tenderness.     Hernia: No hernia is present.  Genitourinary:    Penis: Normal.        Comments: Has chafing to the perineum and peri anal area Neurological:     Mental Status: He is alert.      No results found for any visits on 11/15/24.    The 10-year ASCVD risk score (Arnett DK, et al., 2019) is: 10.6%    Assessment & Plan:   Problem List Items Addressed This Visit   None Visit Diagnoses  Scrotal mass    -  Primary   Relevant Orders   US  SCROTUM W/DOPPLER   WOUND CULTURE     Abscess       Relevant Medications   doxycycline  (VIBRA -TABS) 100 MG tablet   Other Relevant Orders   WOUND CULTURE     Chafing         Assessment and Plan Assessment & Plan Recurrent perineal/scrotal cyst/abscess Recurrent cyst on the right perineal/scrotal area with intermittent discharge. No signs of malignancy, but further evaluation needed. - Performed physical examination of the perineal/scrotal area. - Reviewed laboratory results. - obtain US  scrotum - treat with doxycycline  100mg  BID -use OTC skin protectant. Keep area clean and dry. Dab dry vs scrub dry   Return if symptoms worsen or fail to improve.    Adina Crandall, NP

## 2024-11-15 NOTE — Patient Instructions (Signed)
 Nice to see you today  I will be in touch with the Ultrasound results once I have them  Follow up if you do not start improving with the anitbiotics  You can get some over the counter butt paste/destin to help protect your skin

## 2024-11-16 ENCOUNTER — Ambulatory Visit
Admission: RE | Admit: 2024-11-16 | Discharge: 2024-11-16 | Disposition: A | Discharge status: Home / Self Care | Source: Ambulatory Visit | Attending: Nurse Practitioner | Admitting: Nurse Practitioner

## 2024-11-16 DIAGNOSIS — N503 Cyst of epididymis: Secondary | ICD-10-CM | POA: Diagnosis not present

## 2024-11-16 DIAGNOSIS — N5089 Other specified disorders of the male genital organs: Secondary | ICD-10-CM | POA: Insufficient documentation

## 2024-11-18 LAB — AEROBIC CULTURE
MICRO NUMBER:: 17373705
SPECIMEN QUALITY:: ADEQUATE

## 2024-11-19 ENCOUNTER — Telehealth: Payer: Self-pay

## 2024-11-19 ENCOUNTER — Ambulatory Visit: Payer: Self-pay | Admitting: Nurse Practitioner

## 2024-11-19 NOTE — Telephone Encounter (Signed)
 Left voicemail for patient to call the office back.

## 2024-11-19 NOTE — Telephone Encounter (Signed)
 Spoke with patient and relayed US  results.  Pt states that the antibiotics are working. Has no concerns.  Pt verbalized understanding and has no questions.

## 2024-11-19 NOTE — Telephone Encounter (Signed)
 Can we call and review  the ultrasound with the patient.  The ultrasound showed infection in the skin of his scrotum (cellulitis). I did place him on antibiotics, this should be helping. Can we see how he is doing?  It also showed a cyst on the right testicle. Nothing further is needed with his currently  It also another mass (likely a lipoma which is fat tissue) but since they do not have a older US  to compare it to we will repeat the ultrasound in 6 months   Let me know if there are any questions

## 2024-11-19 NOTE — Telephone Encounter (Signed)
 Noted

## 2024-11-19 NOTE — Telephone Encounter (Signed)
 Received call from [place], [id number], stating she had Randy Roman from [facility] on the line and she was transferring me. I was placed on hold for a min, then call disconnected.   Randy Roman called back apologizing, stating Randy Roman must have hung up. Randy Roman provided phn # [phone] for [facility].   I call phn # provided. Spoke with Randy Roman notifying him I'm returning call to [place] for STAT call report. Randy Roman was able to see reason for call and asked if scrotum US  impression can be seen in chart. I confirmed we can see it and that I will inform the provider. Randy Roman verbalizes understanding and states he will notify Randy Roman we have spoken and results relayed.

## 2025-11-06 ENCOUNTER — Encounter: Admitting: General Practice
